# Patient Record
Sex: Female | Born: 1985
Health system: Southern US, Community
[De-identification: ages and names within clinical notes are randomized; demographics above are authoritative.]

## PROBLEM LIST (undated history)

## (undated) DIAGNOSIS — F419 Anxiety disorder, unspecified: Principal | ICD-10-CM

## (undated) HISTORY — DX: Anxiety disorder, unspecified: F41.9

---

## 2007-12-18 ENCOUNTER — Emergency Department (HOSPITAL_COMMUNITY): Admission: EM | Admit: 2007-12-18 | Discharge: 2007-12-18 | Payer: Self-pay | Admitting: Emergency Medicine

## 2010-09-18 LAB — TSH: TSH: 1.2 u[IU]/mL (ref 0.41–5.90)

## 2010-09-18 LAB — HEPATIC FUNCTION PANEL: Bilirubin, Total: 0.4 mg/dL

## 2012-04-13 ENCOUNTER — Ambulatory Visit (INDEPENDENT_AMBULATORY_CARE_PROVIDER_SITE_OTHER): Payer: 59 | Admitting: Family Medicine

## 2012-04-13 ENCOUNTER — Encounter: Payer: Self-pay | Admitting: Family Medicine

## 2012-04-13 VITALS — BP 134/89 | HR 94 | Ht 62.0 in | Wt 141.0 lb

## 2012-04-13 DIAGNOSIS — F411 Generalized anxiety disorder: Secondary | ICD-10-CM

## 2012-04-13 DIAGNOSIS — F329 Major depressive disorder, single episode, unspecified: Secondary | ICD-10-CM

## 2012-04-13 DIAGNOSIS — F419 Anxiety disorder, unspecified: Secondary | ICD-10-CM | POA: Insufficient documentation

## 2012-04-13 DIAGNOSIS — F32A Depression, unspecified: Secondary | ICD-10-CM

## 2012-04-13 HISTORY — DX: Anxiety disorder, unspecified: F41.9

## 2012-04-13 MED ORDER — CLONAZEPAM 0.5 MG PO TABS: 0.2500 mg | ORAL_TABLET | Freq: Two times a day (BID) | ORAL | Status: DC | PRN

## 2012-04-13 MED ORDER — CITALOPRAM HYDROBROMIDE 10 MG PO TABS: 10.0000 mg | ORAL_TABLET | Freq: Every day | ORAL | Status: DC

## 2012-04-13 NOTE — Progress Notes (Signed)
CC: Melanie Fritz is a 26 y.o. female is here for Establish Care and Medication Refill   Subjective: HPI:  Last papsmear 2 year ago  Pleasant 26 year old here to establish care, former provider at [facility] no longer there.   carries a diagnosis of anxiety for the past 2 years. Was originally placed on Klonopin has not tried any other agents. She's pleased with the way this controls her anxiety. Anxiety is mild in severity at this time. Seems to be worse more time she spends with her mother, seems to be  exacerbated when arguing with her significant other.  Improves more time she spends on her own interests and hobbies, improves with Klonopin. Anxiety can be present anytime of the day. Nothing else makes better or worse.  Over the past 2 weeks have you been bothered by: - Little interest or pleasure in doing things: yes - Feeling down depressed or hopeless:yes  She reports crying spells 3 weeks ago that involved crying episodes occurring multiple times daily for no particular reason. This is improved somewhat but she still feels feelings of sadness for reason she's not sure of. She's had some decreased appetite, decreased interest in spending time with her significant other, continues to want to stay social and denies sleep disturbances. She's worried that she may be on the verge of a breakup with her significant other. Feelings have been present for 3 weeks, mild to moderate in severity, nothing seems to make better or worse, feelings can be present all hours of the day. No interventions as of yet. Denies tobacco use, recreational drug use, and only drinks sparingly  Review of Systems - General ROS: negative for - chills, fever, night sweats, weight gain or weight loss Ophthalmic ROS: negative for - decreased vision Psychological ROS: negative for - paranoia, mental disturbance, mania ENT ROS: negative for - hearing change, nasal congestion, tinnitus or allergies Hematological and Lymphatic  ROS: negative for - bleeding problems, bruising or swollen lymph nodes Breast ROS: negative Respiratory ROS: no cough, shortness of breath, or wheezing Cardiovascular ROS: no chest pain or dyspnea on exertion Gastrointestinal ROS: no abdominal pain, change in bowel habits, or black or bloody stools Genito-Urinary ROS: negative for - genital discharge, genital ulcers, incontinence or abnormal bleeding from genitals Musculoskeletal ROS: negative for - joint pain or muscle pain Neurological ROS: negative for - headaches or memory loss Dermatological ROS: negative for lumps, mole changes, rash and skin lesion changes  Past Medical History  Diagnosis Date  . Anxiety 04/13/2012     Family History  Problem Relation Age of Onset  . Diabetes Father   . Leukemia    . Heart attack      grandfather  . Hypertension Mother   . Stroke      grandfather     History  Substance Use Topics  . Smoking status: Former Smoker    Quit date: 10/13/2010  . Smokeless tobacco: Not on file  . Alcohol Use: Yes     Objective: Filed Vitals:   04/13/12 0905  BP: 134/89  Pulse: 94    General: Alert and Oriented, No Acute Distress HEENT: Pupils equal, round, reactive to light. Conjunctivae clear.  External ears unremarkable, canals clear with intact TMs with appropriate landmarks.  Middle ear appears open without effusion. Pink inferior turbinates.  Moist mucous membranes, pharynx without inflammation nor lesions.  Neck supple without palpable lymphadenopathy nor abnormal masses. Lungs: Clear to auscultation bilaterally, no wheezing/ronchi/rales.  Comfortable work of breathing. Good  air movement. Cardiac: Regular rate and rhythm. Normal S1/S2.  No murmurs, rubs, nor gallops.   Extremities: No peripheral edema.  Strong peripheral pulses.  Mental Status: No depression, anxiety, nor agitation. Well-dressed, good eye contact, normal and logical thought process Skin: Warm and dry.  Assessment & Plan: Melanie Fritz  was seen today for establish care and medication refill.  Diagnoses and associated orders for this visit:  Anxiety - clonazePAM (KLONOPIN) 0.5 MG tablet; Take 0.5 tablets (0.25 mg total) by mouth 2 (two) times daily as needed for anxiety. 1/2 tablet twice daily  Depression - citalopram (CELEXA) 10 MG tablet; Take 1 tablet (10 mg total) by mouth daily.  Other Orders - Discontinue: clonazePAM (KLONOPIN) 0.5 MG tablet; Take 0.5 mg by mouth. 1/2 tablet twice daily    Anxiety: Controlled, continue Klonopin, provided 1 month prescription with the understanding that she signs a records release for progress notes and labs from her former provider confirming her report and Klonopin regimen. Unable to access [facility] controlled substance database at the time of her visit. Depression: Clinically it sounds like she has an element of depression, mild. She is open to the idea of starting SSRI, discussed that this may even help limit her need for Klonopin. Return in 4 weeks to assess response. Return in about 4 weeks (around 05/11/2012).

## 2012-05-03 ENCOUNTER — Encounter: Payer: Self-pay | Admitting: Family Medicine

## 2012-05-03 DIAGNOSIS — N760 Acute vaginitis: Secondary | ICD-10-CM

## 2012-05-03 DIAGNOSIS — B9689 Other specified bacterial agents as the cause of diseases classified elsewhere: Secondary | ICD-10-CM | POA: Insufficient documentation

## 2012-05-03 DIAGNOSIS — F419 Anxiety disorder, unspecified: Secondary | ICD-10-CM

## 2012-05-05 ENCOUNTER — Encounter: Payer: Self-pay | Admitting: *Deleted

## 2012-05-11 ENCOUNTER — Ambulatory Visit (INDEPENDENT_AMBULATORY_CARE_PROVIDER_SITE_OTHER): Payer: 59 | Admitting: Family Medicine

## 2012-05-11 ENCOUNTER — Encounter: Payer: Self-pay | Admitting: Family Medicine

## 2012-05-11 ENCOUNTER — Other Ambulatory Visit: Payer: Self-pay | Admitting: Family Medicine

## 2012-05-11 VITALS — BP 114/79 | HR 86 | Wt 142.0 lb

## 2012-05-11 DIAGNOSIS — N898 Other specified noninflammatory disorders of vagina: Secondary | ICD-10-CM

## 2012-05-11 DIAGNOSIS — F411 Generalized anxiety disorder: Secondary | ICD-10-CM

## 2012-05-11 DIAGNOSIS — F419 Anxiety disorder, unspecified: Secondary | ICD-10-CM

## 2012-05-11 MED ORDER — METRONIDAZOLE 0.75 % VA GEL: 1.0000 | Freq: Two times a day (BID) | VAGINAL | Status: DC

## 2012-05-11 MED ORDER — CLONAZEPAM 0.5 MG PO TABS: 0.2500 mg | ORAL_TABLET | Freq: Two times a day (BID) | ORAL | Status: DC | PRN

## 2012-05-11 NOTE — Progress Notes (Signed)
CC: Melanie Fritz is a 27 y.o. female is here for Anxiety and Depression   Subjective: HPI:  Followup  Anxiety: Was started on citalopram at her last visit, made anxiety worse so she discontinued it herself. Has been taking a dose of Klonopin twice a day which has"drastically"improve her quality of life and has significantly decreased her anxiety. Is described as mild to absent on a daily basis. Worsened with the holiday season at work, improved now that scheduled more predictable. Relationship with mother and patient's significant other is going great. She denies depression, anxiety, paranoia, hallucinations, nor mental disturbance. Records reviewed from her former provider  Complains of recurrent vaginal itching and thick discharge at the end of her menstrual cycle. This is been going on for years and is mentioned in outside records. Response to vaginal metronidazole. Nothing else makes better or worse. Happens monthly. Denies pelvic pain during prior or after menstruation. Denies fevers, chills, nor back pain nor dysuria. Requests suppressive therapy if possible and confirmation of current vaginal itching possibly do to Bactrim diagnosis. Unscented tampons, yogurt daily, no douching  Review Of Systems Outlined In HPI  Past Medical History  Diagnosis Date  . Anxiety 04/13/2012     Family History  Problem Relation Age of Onset  . Diabetes Father   . Leukemia    . Heart attack      grandfather  . Hypertension Mother   . Stroke      grandfather     History  Substance Use Topics  . Smoking status: Former Smoker    Quit date: 10/13/2010  . Smokeless tobacco: Not on file  . Alcohol Use: Yes     Objective: Filed Vitals:   05/11/12 0925  BP: 114/79  Pulse: 86    General: Alert and Oriented, No Acute Distress HEENT: Pupils equal, round, reactive to light. Conjunctivae clear.   Moist mucous membranes,    Lungs: Clear to auscultation bilaterally, no wheezing/ronchi/rales.   Comfortable work of breathing. Good air movement. Cardiac: Regular rate and rhythm. Normal S1/S2.  No murmurs, rubs, nor gallops.   Abdomen: Nontender Extremities: No peripheral edema.  Strong peripheral pulses.  Mental Status: No depression, anxiety, nor agitation. Good eye contact, pleasant, well-dressed, logical thought process Skin: Warm and dry.  Assessment & Plan: Melanie Fritz was seen today for anxiety and depression.  Diagnoses and associated orders for this visit:  Anxiety - clonazePAM (KLONOPIN) 0.5 MG tablet; Take 0.5 tablets (0.25 mg total) by mouth 2 (two) times daily as needed for anxiety.  Vaginal discharge - metroNIDAZOLE (METROGEL) 0.75 % vaginal gel; Place 1 Applicatorful vaginally 2 (two) times daily. - Wet prep, genital    Vaginal discharge: She would prefer to self swabbed when her period is over, provided with sample collection kit. We'll treat empirically with metronidazole once her period stops. If Bv is confirmed we'll consider suppression therapv / refills Anxiety: Stable and improved, discontinue citalopram continue Klonopin  Return in about 3 months (around 08/09/2012).

## 2012-05-17 ENCOUNTER — Telehealth: Payer: Self-pay | Admitting: Family Medicine

## 2012-05-17 LAB — WET PREP BY MOLECULAR PROBE: Candida species: NEGATIVE

## 2012-05-17 NOTE — Telephone Encounter (Signed)
Melanie Fritz, Will you please let Melanie Fritz know that I haven't forgotten about her, for some reason the lab is taking forever with her sample from last week.  I'll keep an eye out for it.

## 2012-05-18 ENCOUNTER — Telehealth: Payer: Self-pay | Admitting: Family Medicine

## 2012-05-18 DIAGNOSIS — N898 Other specified noninflammatory disorders of vagina: Secondary | ICD-10-CM

## 2012-05-18 DIAGNOSIS — N760 Acute vaginitis: Secondary | ICD-10-CM | POA: Insufficient documentation

## 2012-05-18 MED ORDER — METRONIDAZOLE 0.75 % VA GEL: VAGINAL | Status: DC

## 2012-05-18 NOTE — Telephone Encounter (Signed)
Left messsage to call back

## 2012-05-18 NOTE — Telephone Encounter (Signed)
Sue Lush, Will you please let Melanie Fritz know that her swab results came back confirming the bacterial infection she suspected.  If she already completed the gel treatment we discussed she can use a suppression regimen with the same medication applied twice a week.  Literature suggests doing this for at least four months so I've sent in a new Rx with plenty [facility] [place]. Thank you

## 2012-05-19 NOTE — Telephone Encounter (Signed)
Pt.notified

## 2012-06-06 ENCOUNTER — Ambulatory Visit (INDEPENDENT_AMBULATORY_CARE_PROVIDER_SITE_OTHER): Payer: 59 | Admitting: Family Medicine

## 2012-06-06 VITALS — BP 125/85 | HR 96 | Wt 146.0 lb

## 2012-06-06 DIAGNOSIS — F329 Major depressive disorder, single episode, unspecified: Secondary | ICD-10-CM

## 2012-06-06 DIAGNOSIS — F32A Depression, unspecified: Secondary | ICD-10-CM

## 2012-06-06 MED ORDER — BUPROPION HCL ER (XL) 150 MG PO TB24: 150.0000 mg | ORAL_TABLET | ORAL | Status: DC

## 2012-06-06 NOTE — Progress Notes (Signed)
CC: Melanie Fritz is a 27 y.o. female is here for Anxiety and Depression   Subjective: HPI:   patient presents with concerns for a remote history of depression may be returning. For the past 4 weeks she is noticed increased irritability, snapping at loved ones and coworkers. Associated with a daily sense of sadness and gloom.  Above symptoms are described as moderate in severity. Nothing particularly makes them better or worse, she cannot think of any life changing events that precipitated these symptoms.  She also complains of unintentional weight gain, and early-morning awakening on most nights of the week. No interventions as of yet. Symptoms are present on a daily basis. She denies thoughts of wanting to harm herself or others. She feels her anxiety is relatively well controlled right now. She denies manic symptoms, hallucinations, paranoia. She denies rapid heartbeat, tremor, unintentional weight loss, shortness of breath, chest pain, GI disturbance.    Review Of Systems Outlined In HPI  Past Medical History  Diagnosis Date  . Anxiety 04/13/2012     Family History  Problem Relation Age of Onset  . Diabetes Father   . Leukemia    . Heart attack      grandfather  . Hypertension Mother   . Stroke      grandfather     History  Substance Use Topics  . Smoking status: Former Smoker    Quit date: 10/13/2010  . Smokeless tobacco: Not on file  . Alcohol Use: Yes     Objective: Filed Vitals:   06/06/12 0901  BP: 125/85  Pulse: 96    General: Alert and Oriented, No Acute Distress HEENT: Pupils equal, round, reactive to light. Conjunctivae clear. Moist mucous membranes Lungs: Clear and comfortable work of breathing Cardiac: Regular rate and rhythm.  Extremities: No peripheral edema.  Strong peripheral pulses.  Mental Status: No anxiety, nor agitation. Well-dressed, logical thought process, good eye contact. Skin: Warm and dry.  Assessment & Plan: Melanie Fritz was seen today for  anxiety and depression.  Diagnoses and associated orders for this visit:  Depression - buPROPion (WELLBUTRIN XL) 150 MG 24 hr tablet; Take 1 tablet (150 mg total) by mouth every morning.    Depression: Uncontrolled, Patient did not respond much to Zoloft therefore will try Wellbutrin and consider Effexor if not improved in 4 weeks. Discussed self titrating to 300 mg every morning if no improvement after 2 weeks.  Return in about 4 weeks (around 07/04/2012).

## 2012-06-23 ENCOUNTER — Telehealth: Payer: Self-pay | Admitting: *Deleted

## 2012-06-23 DIAGNOSIS — F329 Major depressive disorder, single episode, unspecified: Secondary | ICD-10-CM

## 2012-06-23 DIAGNOSIS — F32A Depression, unspecified: Secondary | ICD-10-CM

## 2012-06-23 NOTE — Telephone Encounter (Signed)
Pt calls & states that the wellbutrin you started her on has been making her very sick on her stomach & is giving her back headaches.

## 2012-06-24 MED ORDER — VENLAFAXINE HCL ER 75 MG PO CP24: ORAL_CAPSULE | ORAL | Status: DC

## 2012-06-24 NOTE — Telephone Encounter (Signed)
Left detailed message on vm and to call back if any questions

## 2012-06-24 NOTE — Telephone Encounter (Signed)
Melanie Fritz, Will you please ask Melanie Fritz to switch from wellbutrin to effexor using the following plan: If she has been taking wellbutrin 300mg  daily then cut to 150mg  daily for three days then start new regimen of effexor.  If she's only been taking 150mg  daily then she can switch immediately to the new effexor regimen.  [facility]

## 2012-07-04 ENCOUNTER — Ambulatory Visit (INDEPENDENT_AMBULATORY_CARE_PROVIDER_SITE_OTHER): Payer: 59 | Admitting: Family Medicine

## 2012-07-04 ENCOUNTER — Encounter: Payer: Self-pay | Admitting: Family Medicine

## 2012-07-04 VITALS — BP 114/73 | HR 104 | Wt 149.0 lb

## 2012-07-04 DIAGNOSIS — B9689 Other specified bacterial agents as the cause of diseases classified elsewhere: Secondary | ICD-10-CM

## 2012-07-04 DIAGNOSIS — N760 Acute vaginitis: Secondary | ICD-10-CM

## 2012-07-04 DIAGNOSIS — A499 Bacterial infection, unspecified: Secondary | ICD-10-CM

## 2012-07-04 DIAGNOSIS — F32A Depression, unspecified: Secondary | ICD-10-CM

## 2012-07-04 DIAGNOSIS — F329 Major depressive disorder, single episode, unspecified: Secondary | ICD-10-CM

## 2012-07-04 MED ORDER — METRONIDAZOLE 0.75 % VA GEL: VAGINAL | Status: DC

## 2012-07-04 MED ORDER — VENLAFAXINE HCL ER 75 MG PO CP24: ORAL_CAPSULE | ORAL | Status: DC

## 2012-07-04 NOTE — Progress Notes (Signed)
CC: Melanie Fritz is a 27 y.o. female is here for Depression   Subjective: HPI:  Followup depression: Patient tried Wellbutrin on a daily basis  For about 2 weeks however developed almost daily single episode of vomiting and migraines most days of the week. Interestingly her depression and short temper were completely resolved while on the medication. She did not want to tolerate the side effects understandably so stopped 2 weeks ago. She continues to have daily snapping episodes in a short temper with her colleagues and significant other. She denies worsening severity or frequency of symptoms since last being seen.  Unfortunately she did not get the message about switching to Effexor.  She denies mental disturbance, paranoia, hallucinations, sleep disturbance, nor thoughts of it on herself or others  She has a history of bacterial vaginosis on a monthly basis following her menses. She was never notified by her pharmacy that a suppression regimen of MetroGel was sent there a few months ago. She denies any genitourinary complaints today   Review Of Systems Outlined In HPI  Past Medical History  Diagnosis Date  . Anxiety 04/13/2012     Family History  Problem Relation Age of Onset  . Diabetes Father   . Leukemia    . Heart attack      grandfather  . Hypertension Mother   . Stroke      grandfather     History  Substance Use Topics  . Smoking status: Former Smoker    Quit date: 10/13/2010  . Smokeless tobacco: Not on file  . Alcohol Use: Yes     Objective: Filed Vitals:   07/04/12 0900  BP: 114/73  Pulse: 104    Vital signs reviewed. General: Alert and Oriented, No Acute Distress HEENT: Pupils equal, round, reactive to light. Conjunctivae clear.  External ears unremarkable.  Moist mucous membranes. Lungs: Clear and comfortable work of breathing, speaking in full sentences without accessory muscle use. Cardiac: Regular rate and rhythm.  Neuro: CN II-XII grossly intact, gait  normal. Extremities: No peripheral edema.  Strong peripheral pulses.  Mental Status: No depression, anxiety, nor agitation. Logical though process. Skin: Warm and dry.  Assessment & Plan: Malarie was seen today for depression.  Diagnoses and associated orders for this visit:  Bacterial vaginitis - metroNIDAZOLE (METROGEL VAGINAL) 0.75 % vaginal gel; Place one applicator vaginally QHS twice a week for four months.  Depression - venlafaxine XR (EFFEXOR XR) 75 MG 24 hr capsule; Start with one capsule daily for five days then two capsules daily from then on.    Depression: Uncontrolled, switching to Effexor and discussed titrating regimen. Followup in 2 weeks to address effectiveness. Recurrent bacterial vaginitis: Suppression regimen sent to a new pharmacy for her.  Return in about 2 weeks (around 07/18/2012).

## 2012-07-30 ENCOUNTER — Other Ambulatory Visit: Payer: Self-pay | Admitting: Family Medicine

## 2012-08-01 ENCOUNTER — Ambulatory Visit: Payer: 59 | Admitting: Family Medicine

## 2012-08-10 ENCOUNTER — Telehealth: Payer: Self-pay | Admitting: Family Medicine

## 2012-08-10 NOTE — Telephone Encounter (Signed)
[facility] as she's no longer taking this.

## 2012-08-22 ENCOUNTER — Ambulatory Visit (HOSPITAL_BASED_OUTPATIENT_CLINIC_OR_DEPARTMENT_OTHER)
Admission: RE | Admit: 2012-08-22 | Discharge: 2012-08-22 | Disposition: A | Discharge status: Home / Self Care | Payer: 59 | Source: Ambulatory Visit | Attending: Family Medicine | Admitting: Family Medicine

## 2012-08-22 ENCOUNTER — Ambulatory Visit (INDEPENDENT_AMBULATORY_CARE_PROVIDER_SITE_OTHER): Payer: 59 | Admitting: Family Medicine

## 2012-08-22 ENCOUNTER — Encounter: Payer: Self-pay | Admitting: Family Medicine

## 2012-08-22 VITALS — BP 119/86 | HR 86 | Wt 151.0 lb

## 2012-08-22 DIAGNOSIS — R5383 Other fatigue: Secondary | ICD-10-CM

## 2012-08-22 DIAGNOSIS — Q742 Other congenital malformations of lower limb(s), including pelvic girdle: Secondary | ICD-10-CM | POA: Insufficient documentation

## 2012-08-22 DIAGNOSIS — M25579 Pain in unspecified ankle and joints of unspecified foot: Secondary | ICD-10-CM

## 2012-08-22 DIAGNOSIS — M25476 Effusion, unspecified foot: Secondary | ICD-10-CM | POA: Insufficient documentation

## 2012-08-22 DIAGNOSIS — S93401A Sprain of unspecified ligament of right ankle, initial encounter: Secondary | ICD-10-CM

## 2012-08-22 DIAGNOSIS — M25571 Pain in right ankle and joints of right foot: Secondary | ICD-10-CM

## 2012-08-22 DIAGNOSIS — R5381 Other malaise: Secondary | ICD-10-CM

## 2012-08-22 DIAGNOSIS — M25473 Effusion, unspecified ankle: Secondary | ICD-10-CM | POA: Insufficient documentation

## 2012-08-22 DIAGNOSIS — S93409A Sprain of unspecified ligament of unspecified ankle, initial encounter: Secondary | ICD-10-CM

## 2012-08-22 LAB — BASIC METABOLIC PANEL WITH GFR
BUN: 8 mg/dL (ref 6–23)
Chloride: 102 mEq/L (ref 96–112)
Creat: 0.62 mg/dL (ref 0.50–1.10)
GFR, Est Non African American: >89 mL/min

## 2012-08-22 LAB — CBC
HCT: 38.2 % (ref 36.0–46.0)
MCH: 31.2 pg (ref 26.0–34.0)
MCV: 89 fL (ref 78.0–100.0)
RDW: 13.4 % (ref 11.5–15.5)
WBC: 5.4 10*3/uL (ref 4.0–10.5)

## 2012-08-22 MED ORDER — TRAMADOL HCL 50 MG PO TABS: 50.0000 mg | ORAL_TABLET | Freq: Four times a day (QID) | ORAL | Status: DC | PRN

## 2012-08-22 MED ORDER — MELOXICAM 15 MG PO TABS: 15.0000 mg | ORAL_TABLET | Freq: Every day | ORAL | Status: DC

## 2012-08-22 NOTE — Progress Notes (Signed)
CC: Melanie Fritz is a 27 y.o. female is here for right ankle pain   Subjective: HPI:  Patient complains of right ankle pain. This has been present for the past 3 weeks. It came on gradually and today that she sustained a inversion injury of the right ankle while carrying large bags of money at work. She had immediate electric-like pain that radiated up her right leg but was able to walk at the time of injury without much discomfort. Pain reached maximum the next day, described only as pain, moderate to severe in severity. Slightly improved with ibuprofen. Worse with weightbearing or moving of the foot, slightly improved with rest. Associated with swelling that is absent in the morning and worsens as the day progresses. Has been wearing a ankle brace but she feels that it's too loose. She denies motor or sensory disturbances in the right leg or foot.  Localizes pain to the lateral aspect of the ankle and slightly radiates up her right leg.  Denies any prior ankle injury.  Patient complains of fatigue that has been present for one to 2 months. Is presently daily basis. Described as mild to moderate in severity. Nothing makes it better or worse. She denies sleep disturbance, anxiety, depression, nor mental disturbance. He has been accompanied by unintentional weight gain without change in diet or exercise.  Denies fevers, chills, cough, shortness of breath, chest pain, abdominal pain, GI disturbance.   Review Of Systems Outlined In HPI  Past Medical History  Diagnosis Date  . Anxiety 04/13/2012     Family History  Problem Relation Age of Onset  . Diabetes Father   . Leukemia    . Heart attack      grandfather  . Hypertension Mother   . Stroke      grandfather     History  Substance Use Topics  . Smoking status: Former Smoker    Quit date: 10/13/2010  . Smokeless tobacco: Not on file  . Alcohol Use: Yes     Objective: Filed Vitals:   08/22/12 1011  BP: 119/86  Pulse: 86     Vital signs reviewed. General: Alert and Oriented, No Acute Distress HEENT: Pupils equal, round, reactive to light. Conjunctivae clear.  External ears unremarkable.  Moist mucous membranes. Lungs: Clear and comfortable work of breathing, speaking in full sentences without accessory muscle use. Cardiac: Regular rate and rhythm.  Neuro: CN II-XII grossly intact, gait normal. Extremities: No peripheral edema.  Strong peripheral pulses.  Right ankle: Anterior for negative. no pain at the navicular bone, fifth metatarsal nor medial malleolus. Pain is reproduced with palpation of any aspect of distal lateral malleoli or resisted inversion. Mild swelling at the site of pain. Pain is reproduced with compression of the fibula and tibia without pain at head of fibula on direct palpation. Able to bear weight but in obvious discomfort. Mental Status: No depression, anxiety, nor agitation. Logical though process. Skin: Warm and dry.  Assessment & Plan: Melanie Fritz was seen today for right ankle pain.  Diagnoses and associated orders for this visit:  Fatigue - TSH - CBC - BASIC METABOLIC PANEL WITH GFR - B12  Right ankle pain - DG Ankle Complete Right; Future  Right ankle sprain, initial encounter - meloxicam (MOBIC) 15 MG tablet; Take 1 tablet (15 mg total) by mouth daily. - traMADol (ULTRAM) 50 MG tablet; Take 1 tablet (50 mg total) by mouth every 6 (six) hours as needed for pain.    Fatigue: We'll rule out  anemia, thyroid abnormality, B12 deficiency, metabolic issues with labs above. Right ankle pain: X-ray was obtained it did not show a bulge in nor fracture, images were reviewed with the patient and reassurance was given.  Right ankle sprain. She was fitted in a Aircast ankle brace and crutches and we discussed rehabilitation exercises to do at home with a handout demonstrating different modalities. I've asked her to return in 1-2 weeks if not improving at all and to see Dr. Karie Schwalbe. in sports  medicine for specialist evaluation.  25 minutes spent face-to-face during visit today of which at least 50% was counseling or coordinating care regarding right ankle sprain, fatigue.Marland Kitchen   Return in about 2 weeks (around 09/05/2012).

## 2012-09-05 ENCOUNTER — Ambulatory Visit (INDEPENDENT_AMBULATORY_CARE_PROVIDER_SITE_OTHER): Payer: 59 | Admitting: Sports Medicine

## 2012-09-05 ENCOUNTER — Encounter: Payer: Self-pay | Admitting: Sports Medicine

## 2012-09-05 VITALS — BP 132/89 | HR 89

## 2012-09-05 DIAGNOSIS — M25579 Pain in unspecified ankle and joints of unspecified foot: Secondary | ICD-10-CM

## 2012-09-05 DIAGNOSIS — M25571 Pain in right ankle and joints of right foot: Secondary | ICD-10-CM

## 2012-09-05 DIAGNOSIS — S93401A Sprain of unspecified ligament of right ankle, initial encounter: Secondary | ICD-10-CM | POA: Insufficient documentation

## 2012-09-05 MED ORDER — HYDROCODONE-ACETAMINOPHEN 5-325 MG PO TABS: 1.0000 | ORAL_TABLET | Freq: Three times a day (TID) | ORAL | Status: DC | PRN

## 2012-09-05 NOTE — Progress Notes (Signed)
   Subjective:    I'm seeing this patient as a consultation for:  Dr. 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 She's had significant pain since then but never sought medical care. Now she localizes her pain over the ATFL., radiating around the malleolus laterally and up the leg. It is very difficult for her to bear weight, she recalls when she had the injury, having bruising and significant swelling.   Pain is severe, stable. She borrowed a Designer, multimedia from her sister 2 days ago and notes that this helps her pain significantly.   Past medical history, Surgical history, Family history not pertinant except as noted below, Social history, Allergies, and medications have been entered into the medical record, reviewed, and no changes needed.   Review of Systems: No headache, visual changes, nausea, vomiting, diarrhea, constipation, dizziness, abdominal pain, skin rash, fevers, chills, night sweats, weight loss, swollen lymph nodes, body aches, joint swelling, muscle aches, chest pain, shortness of breath, mood changes, visual or auditory hallucinations.   Objective:   General: Well Developed, well nourished, and in no acute distress.  Neuro/Psych: Alert and oriented x3, extra-ocular muscles intact, able to move all 4 extremities, sensation grossly intact. Skin: Warm and dry, no rashes noted.  Respiratory: Not using accessory muscles, speaking in full sentences, trachea midline.  Cardiovascular: Pulses palpable, no extremity edema. Abdomen: Does not appear distended. Right Ankle: Phyllis over the sinus tarsi. Range of motion is full in all directions. Strength is 5/5 in all directions. Stable lateral and medial ligaments; squeeze test and kleiger test unremarkable; Tender to palpation over the lateral talar dome as well as the ATF  L. No pain at base of 5th MT; No tenderness over cuboid; No tenderness over N spot or navicular prominence No tenderness on posterior aspects of lateral and medial malleolus mild tenderness behind the lateral malleolus No sign of peroneal tendon subluxations or tenderness to palpation Negative tarsal tunnel tinel's Able to walk 4 steps, but limps.   I reviewed her x-rays, there is no sign of acute fracture or dislocation or degenerative change. Impression and Recommendations:   This case required medical decision making of moderate complexity.

## 2012-09-05 NOTE — Assessment & Plan Note (Addendum)
I think that Melanie Fritz has a fairly severe, grade 2 ATF L. sprain. I also think that she has bruised her lateral talar dome. Unfortunately this has gone for about 3 months. She's only been immobilization now for approximately 2 days. Continue Cam Boot, I strapped the ankle with compressive dressing. Upgrading to hydrocodone and continue meloxicam. Continue to come out of the boot and nightly to do rehabilitation exercises. Out of work, and nonweightbearing for 2 weeks. Crutches as long as it's painful to bear weight.

## 2012-09-20 ENCOUNTER — Encounter: Payer: Self-pay | Admitting: Sports Medicine

## 2012-09-20 ENCOUNTER — Ambulatory Visit (INDEPENDENT_AMBULATORY_CARE_PROVIDER_SITE_OTHER): Payer: 59 | Admitting: Sports Medicine

## 2012-09-20 ENCOUNTER — Other Ambulatory Visit: Payer: Self-pay | Admitting: *Deleted

## 2012-09-20 VITALS — BP 124/81 | HR 92

## 2012-09-20 DIAGNOSIS — F32A Depression, unspecified: Secondary | ICD-10-CM

## 2012-09-20 DIAGNOSIS — M25571 Pain in right ankle and joints of right foot: Secondary | ICD-10-CM

## 2012-09-20 DIAGNOSIS — F329 Major depressive disorder, single episode, unspecified: Secondary | ICD-10-CM

## 2012-09-20 DIAGNOSIS — M25579 Pain in unspecified ankle and joints of unspecified foot: Secondary | ICD-10-CM

## 2012-09-20 MED ORDER — VENLAFAXINE HCL ER 75 MG PO CP24: ORAL_CAPSULE | ORAL | Status: DC

## 2012-09-20 NOTE — Progress Notes (Signed)
Disability forms filled out and placed in my out box. Charge sheet filled out.

## 2012-09-20 NOTE — Progress Notes (Signed)
  Subjective:    CC: Followup  HPI: Right ankle sprain: Likely grade 2 anterior talofibular ligament as well as lateral talar dome. She returns today after 3 months of pain, I placed her in Cam Boot immobilization for 2 weeks and she is approximately 80% improved. She still has some pain with weightbearing, but has self discontinued. Pain is localized, doesn't radiate, moderate. Swelling is improved as well. She is not sure she is ready to go back to work yet on the [employer], and I tend to agree.  Past medical history, Surgical history, Family history not pertinant except as noted below, Social history, Allergies, and medications have been entered into the medical record, reviewed, and no changes needed.   Review of Systems: No fevers, chills, night sweats, weight loss, chest pain, or shortness of breath.   Objective:    General: Well Developed, well nourished, and in no acute distress.  Neuro: Alert and oriented x3, extra-ocular muscles intact, sensation grossly intact.  HEENT: Normocephalic, atraumatic, pupils equal round reactive to light, neck supple, no masses, no lymphadenopathy, thyroid nonpalpable.  Skin: Warm and dry, no rashes. Cardiac: Regular rate and rhythm, no murmurs rubs or gallops, no lower extremity edema.  Respiratory: Clear to auscultation bilaterally. Not using accessory muscles, speaking in full sentences. Right Ankle: No visible erythema or swelling. Range of motion is full in all directions. Strength is 5/5 in all directions. Stable lateral and medial ligaments; squeeze test and kleiger test unremarkable; Lateral talar dome was very tender to palpation. Extremely tender to palpation over the anterior talus at the site of the insertion of the anterior talofibular ligament. No pain at base of 5th MT; No tenderness over cuboid; No tenderness over N spot or navicular prominence No tenderness on posterior aspects of lateral and medial malleolus No sign of peroneal  tendon subluxations or tenderness to palpation Negative tarsal tunnel tinel's  Impression and Recommendations:

## 2012-09-20 NOTE — Assessment & Plan Note (Signed)
Over 80% improved with 2 weeks of cam boot immobilization. Unfortunately she still has pain over the lateral talar dome as well as the ATFL. Ankle is stable. I'm going to recommend that she go back into the boot for additional 2 weeks, I will fill out her FMLA paperwork She should come back to see me in 2 weeks, and I can likely clear her.

## 2012-09-29 ENCOUNTER — Encounter: Payer: Self-pay | Admitting: Sports Medicine

## 2012-09-29 ENCOUNTER — Ambulatory Visit (INDEPENDENT_AMBULATORY_CARE_PROVIDER_SITE_OTHER): Payer: 59 | Admitting: Sports Medicine

## 2012-09-29 VITALS — BP 122/83 | HR 103

## 2012-09-29 DIAGNOSIS — M25571 Pain in right ankle and joints of right foot: Secondary | ICD-10-CM

## 2012-09-29 DIAGNOSIS — M25579 Pain in unspecified ankle and joints of unspecified foot: Secondary | ICD-10-CM

## 2012-09-29 NOTE — Assessment & Plan Note (Addendum)
This did likely represent a simple lateral ankle sprain and talar dome contusion. She is out of the boot and pain-free, without limitations. She is fully cleared for work and I can see her back on an as-needed basis.

## 2012-09-29 NOTE — Progress Notes (Signed)
  Subjective:    CC:  followup  HPI: This very pleasant 27 year old female returns for followup of lateral ankle sprain and talar dome bruise. She is out of her boot, and completely pain-free. She is eager to get back into work without restrictions.  Past medical history, Surgical history, Family history not pertinant except as noted below, Social history, Allergies, and medications have been entered into the medical record, reviewed, and no changes needed.   Review of Systems: No fevers, chills, night sweats, weight loss, chest pain, or shortness of breath.   Objective:    General: Well Developed, well nourished, and in no acute distress.  Neuro: Alert and oriented x3, extra-ocular muscles intact, sensation grossly intact.  HEENT: Normocephalic, atraumatic, pupils equal round reactive to light, neck supple, no masses, no lymphadenopathy, thyroid nonpalpable.  Skin: Warm and dry, no rashes. Cardiac: Regular rate and rhythm, no murmurs rubs or gallops, no lower extremity edema.  Respiratory: Clear to auscultation bilaterally. Not using accessory muscles, speaking in full sentences. Right Ankle: No visible erythema or swelling. Range of motion is full in all directions. Strength is 5/5 in all directions. Stable lateral and medial ligaments; squeeze test and kleiger test unremarkable; Talar dome nontender; No pain at base of 5th MT; No tenderness over cuboid; No tenderness over N spot or navicular prominence No tenderness on posterior aspects of lateral and medial malleolus No sign of peroneal tendon subluxations or tenderness to palpation Negative tarsal tunnel tinel's Able to walk 4 steps. Impression and Recommendations:

## 2012-10-07 ENCOUNTER — Ambulatory Visit: Payer: 59 | Admitting: Sports Medicine

## 2012-11-28 ENCOUNTER — Encounter: Payer: Self-pay | Admitting: Family Medicine

## 2012-11-28 ENCOUNTER — Ambulatory Visit (INDEPENDENT_AMBULATORY_CARE_PROVIDER_SITE_OTHER): Payer: 59 | Admitting: Family Medicine

## 2012-11-28 VITALS — BP 110/79 | HR 71 | Wt 140.0 lb

## 2012-11-28 DIAGNOSIS — F411 Generalized anxiety disorder: Secondary | ICD-10-CM

## 2012-11-28 DIAGNOSIS — F419 Anxiety disorder, unspecified: Secondary | ICD-10-CM

## 2012-11-28 MED ORDER — CLONAZEPAM 0.5 MG PO TABS: 0.2500 mg | ORAL_TABLET | Freq: Two times a day (BID) | ORAL | Status: DC | PRN

## 2012-11-28 NOTE — Progress Notes (Signed)
CC: Melanie Fritz is a 27 y.o. female is here for Anxiety   Subjective: HPI:  Followup anxiety: Patient tells me that prior to 3 weeks ago she was not requiring any Effexor or any other psych medication for her chronic anxiety. Unfortunately 3 weeks ago she was promoted to a job it's causing more stress in her significant other left her, she is now living with her mother which is also causing stress. She describes moderate subjective anxiety on a daily basis that is worsened by her mom been a nuisance to her but for reasons that Shelton admits are ridiculous such as tapping to loud or just been annoying otherwise they have a great relationship.  Symptoms seem to improve to a mild degree when she is by herself. Nothing else makes better or worse. They've not changed since onset 3 weeks ago. She denies subjective depression, paranoia, manic symptoms, thoughts wanting to harm herself or others. She denies fevers, chills, substance use, alcohol use, unintentional weight loss or gain. She does seem to have some diarrhea on the days that she is most stressed out.   Review Of Systems Outlined In HPI  Past Medical History  Diagnosis Date  . Anxiety 04/13/2012     Family History  Problem Relation Age of Onset  . Diabetes Father   . Leukemia    . Heart attack      grandfather  . Hypertension Mother   . Stroke      grandfather     History  Substance Use Topics  . Smoking status: Former Smoker    Quit date: 10/13/2010  . Smokeless tobacco: Not on file  . Alcohol Use: Yes     Objective: Filed Vitals:   11/28/12 1311  BP: 110/79  Pulse: 71    Vital signs reviewed. General: Alert and Oriented, No Acute Distress HEENT: Pupils equal, round, reactive to light. Conjunctivae clear.  External ears unremarkable.  Moist mucous membranes. Lungs: Clear and comfortable work of breathing, speaking in full sentences without accessory muscle use. Cardiac: Regular rate and rhythm.  Neuro: CN II-XII  grossly intact, gait normal. Extremities: No peripheral edema.  Strong peripheral pulses.  Mental Status: No depression,  nor agitation. Appears mildly anxious Logical though process. Skin: Warm and dry.  Assessment & Plan: Chelli was seen today for anxiety.  Diagnoses and associated orders for this visit:  Anxiety - clonazePAM (KLONOPIN) 0.5 MG tablet; Take 0.5 tablets (0.25 mg total) by mouth 2 (two) times daily as needed for anxiety.    Anxiety: Chronic uncontrolled condition exacerbated due to job change in relationship change, discussed restarting Effexor but she dislikes some of the side effects. Discussed restarting former Klonopin regimen which she would rather do on a as-needed basis  Return in about 4 weeks (around 12/26/2012).

## 2013-02-10 ENCOUNTER — Ambulatory Visit (INDEPENDENT_AMBULATORY_CARE_PROVIDER_SITE_OTHER): Payer: 59 | Admitting: Family Medicine

## 2013-02-10 ENCOUNTER — Encounter: Payer: Self-pay | Admitting: Family Medicine

## 2013-02-10 VITALS — BP 119/83 | HR 82 | Wt 138.0 lb

## 2013-02-10 DIAGNOSIS — F411 Generalized anxiety disorder: Secondary | ICD-10-CM

## 2013-02-10 DIAGNOSIS — F419 Anxiety disorder, unspecified: Secondary | ICD-10-CM

## 2013-02-10 MED ORDER — CLONAZEPAM 0.5 MG PO TABS: 0.2500 mg | ORAL_TABLET | Freq: Three times a day (TID) | ORAL | Status: DC | PRN

## 2013-02-10 NOTE — Progress Notes (Signed)
CC: Melanie Fritz is a 27 y.o. female is here for Anxiety   Subjective: HPI:  Complains of worsening panic attacks described as rapid heartbeat, shortness of breath, extreme sense of fear of nothing in particular that has been worsening ever since she found her father dead to days ago. Symptoms are worsened by going through funeral and estate closure proceedings. Symptoms are improved when spending time with her brother and other family members. Other than above she denies any other anxiety or depression nor mental disturbance. She's having trouble sleeping because of the panic attacks they are occurring 2-3 times a day they are eradicate it by taking Klonopin she has run out of her prescription from 2 months ago. Denies cough, wheezing, chest pain, nausea nor fevers.   Review Of Systems Outlined In HPI  Past Medical History  Diagnosis Date  . Anxiety 04/13/2012     Family History  Problem Relation Age of Onset  . Diabetes Father   . Leukemia    . Heart attack      grandfather  . Hypertension Mother   . Stroke      grandfather     History  Substance Use Topics  . Smoking status: Former Smoker    Quit date: 10/13/2010  . Smokeless tobacco: Not on file  . Alcohol Use: Yes     Objective: Filed Vitals:   02/10/13 0905  BP: 119/83  Pulse: 82    Vital signs reviewed. General: Alert and Oriented, No Acute Distress HEENT: Pupils equal, round, reactive to light. Conjunctivae clear.  External ears unremarkable.  Moist mucous membranes. Lungs: Clear and comfortable work of breathing, speaking in full sentences without accessory muscle use. Cardiac: Regular rate and rhythm.  Neuro: CN II-XII grossly intact, gait normal. Extremities: No peripheral edema.  Strong peripheral pulses.  Mental Status: Mild tearfulness, no agitation agitation. Logical though process. Skin: Warm and dry.  Assessment & Plan: Symphany was seen today for anxiety.  Diagnoses and associated orders for this  visit:  Anxiety - clonazePAM (KLONOPIN) 0.5 MG tablet; Take 0.5 tablets (0.25 mg total) by mouth 3 (three) times daily as needed for anxiety.    Anxiety uncontrolled Patient declines counseling but we'll consider if greving continues into November, additionally we'll consider restarting Effexor if symptoms continue into November. Refill as needed Klonopin she understands to take sparingly and only as needed  Return if symptoms worsen or fail to improve.

## 2013-03-20 ENCOUNTER — Encounter: Payer: Self-pay | Admitting: Family Medicine

## 2013-03-20 ENCOUNTER — Ambulatory Visit (INDEPENDENT_AMBULATORY_CARE_PROVIDER_SITE_OTHER): Payer: 59 | Admitting: Family Medicine

## 2013-03-20 VITALS — BP 115/80 | HR 98 | Wt 132.0 lb

## 2013-03-20 DIAGNOSIS — F411 Generalized anxiety disorder: Secondary | ICD-10-CM

## 2013-03-20 DIAGNOSIS — F3289 Other specified depressive episodes: Secondary | ICD-10-CM

## 2013-03-20 DIAGNOSIS — F32A Depression, unspecified: Secondary | ICD-10-CM

## 2013-03-20 DIAGNOSIS — F329 Major depressive disorder, single episode, unspecified: Secondary | ICD-10-CM

## 2013-03-20 DIAGNOSIS — F419 Anxiety disorder, unspecified: Secondary | ICD-10-CM

## 2013-03-20 MED ORDER — VENLAFAXINE HCL ER 75 MG PO CP24: ORAL_CAPSULE | ORAL | Status: DC

## 2013-03-20 NOTE — Progress Notes (Signed)
CC: Melanie Fritz is a 27 y.o. female is here for f/u anxiety   Subjective: HPI:  Followup anxiety colon subjective a she reports she is dealing well with her father's death. The more she talks about the circumstances with her brother the better she feels. She reports there is moderate anxiety every evening when trying to bend this is improved with Klonopin. She reports nonrestorative sleep in the morning, she wants to stay in bed all day long despite weekends or work days. She feels she has low motivation to engage in hobbies or socialize, this is all been present since the time of her father's death, approximately one month ago. She is reluctant to use Klonopin understandably in the daytime due to the sedative effect.  She reports mild subjective depression.  Denies mental disturbance other than above, hallucinations, thoughts of wanting to harm herself or others, paranoia, nor unintentional weight loss or gain. Symptoms are present in a daily basis   Review Of Systems Outlined In HPI  Past Medical History  Diagnosis Date  . Anxiety 04/13/2012     Family History  Problem Relation Age of Onset  . Diabetes Father   . Leukemia    . Heart attack      grandfather  . Hypertension Mother   . Stroke      grandfather     History  Substance Use Topics  . Smoking status: Former Smoker    Quit date: 10/13/2010  . Smokeless tobacco: Not on file  . Alcohol Use: Yes     Objective: Filed Vitals:   03/20/13 0956  BP: 115/80  Pulse: 98    Vital signs reviewed. General: Alert and Oriented, No Acute Distress HEENT: Pupils equal, round, reactive to light. Conjunctivae clear.  External ears unremarkable.  Moist mucous membranes. Lungs: Clear and comfortable work of breathing, speaking in full sentences without accessory muscle use. Cardiac: Regular rate and rhythm.  Neuro: CN II-XII grossly intact, gait normal. Extremities: No peripheral edema.  Strong peripheral pulses.  Mental Status:  No anxiety, nor agitation. Logical though process. Mild depression Skin: Warm and dry.  Assessment & Plan: Melanie Fritz was seen today for f/u anxiety.  Diagnoses and associated orders for this visit:  Anxiety - venlafaxine XR (EFFEXOR XR) 75 MG 24 hr capsule; One by mouth every morning for five days then two by mouth every morning thereafter.  Depression    Anxiety: Well-controlled however I believe depression from adjusting from her father's death is negatively impacting quality of life therefore restart Effexor that she stopped in the spring.  Contract for safety, return in 1 month if no improvement otherwise quality of life is significantly improved return in 3 months  Return in about 4 weeks (around 04/17/2013).

## 2013-05-15 ENCOUNTER — Encounter: Payer: Self-pay | Admitting: Family Medicine

## 2013-05-15 ENCOUNTER — Ambulatory Visit (INDEPENDENT_AMBULATORY_CARE_PROVIDER_SITE_OTHER): Payer: 59 | Admitting: Family Medicine

## 2013-05-15 VITALS — BP 122/86 | HR 92 | Wt 129.0 lb

## 2013-05-15 DIAGNOSIS — F329 Major depressive disorder, single episode, unspecified: Secondary | ICD-10-CM

## 2013-05-15 DIAGNOSIS — F3289 Other specified depressive episodes: Secondary | ICD-10-CM

## 2013-05-15 DIAGNOSIS — F419 Anxiety disorder, unspecified: Secondary | ICD-10-CM

## 2013-05-15 DIAGNOSIS — F32A Depression, unspecified: Secondary | ICD-10-CM

## 2013-05-15 DIAGNOSIS — F411 Generalized anxiety disorder: Secondary | ICD-10-CM

## 2013-05-15 MED ORDER — ESCITALOPRAM OXALATE 5 MG PO TABS: 5.0000 mg | ORAL_TABLET | Freq: Every day | ORAL | Status: DC

## 2013-05-15 MED ORDER — CLONAZEPAM 0.5 MG PO TABS: 0.2500 mg | ORAL_TABLET | Freq: Three times a day (TID) | ORAL | Status: DC | PRN

## 2013-05-15 NOTE — Progress Notes (Signed)
CC: Melanie Fritz is a 28 y.o. female is here for Anxiety/depression   Subjective: HPI:  Followup anxiety: She continues to take Klonopin 1-2 times a day states that this helps tremendously with sudden fluctuations and anxiety. Anxiety was greatly improved while on Effexor and was improved within one week after starting Effexor. Since stopping the medication described below she's had mild exacerbation of her daily anxiety described as states above worrying about anything and everything.  Follow depression: States depression was mild absent on a daily basis while on Effexor however the longer she was on this medication the worse a gradual daily fatigue began to bother her. Symptoms were getting in the way of her work. She tried taking Effexor only at night which did not improve or worsen her fatigue. She stopped this medication one week ago has had slight improvement of fatigue but worsening of depression. Describes depression symptoms as lack of motivation to get out of the house and extreme dissatisfaction with her job. Denies thoughts wanting to harm herself or others   Review Of Systems Outlined In HPI  Past Medical History  Diagnosis Date  . Anxiety 04/13/2012     Family History  Problem Relation Age of Onset  . Diabetes Father   . Leukemia    . Heart attack      grandfather  . Hypertension Mother   . Stroke      grandfather     History  Substance Use Topics  . Smoking status: Former Smoker    Quit date: 10/13/2010  . Smokeless tobacco: Not on file  . Alcohol Use: Yes     Objective: Filed Vitals:   05/15/13 1039  BP: 122/86  Pulse: 92    Vital signs reviewed. General: Alert and Oriented, No Acute Distress HEENT: Pupils equal, round, reactive to light. Conjunctivae clear.  External ears unremarkable.  Moist mucous membranes. Lungs: Clear and comfortable work of breathing, speaking in full sentences without accessory muscle use. Cardiac: Regular rate and rhythm.   Neuro: CN II-XII grossly intact, gait normal. Extremities: No peripheral edema.  Strong peripheral pulses.  Mental Status: Mild depression but no anxiety, nor agitation. Logical though process. Skin: Warm and dry.  Assessment & Plan: Melanie Fritz was seen today for anxiety/depression.  Diagnoses and associated orders for this visit:  Anxiety - clonazePAM (KLONOPIN) 0.5 MG tablet; Take 0.5 tablets (0.25 mg total) by mouth 3 (three) times daily as needed for anxiety.  Depression - escitalopram (LEXAPRO) 5 MG tablet; Take 1 tablet (5 mg total) by mouth daily.    Depression: Uncontrolled chronic condition, continue off of Effexor instead start low-dose Lexapro to followup one month Anxiety: Uncontrolled chronic condition: Starting Lexapro as above however continue Klonopin only as needed   Return in about 4 weeks (around 06/12/2013).

## 2013-06-01 ENCOUNTER — Emergency Department (INDEPENDENT_AMBULATORY_CARE_PROVIDER_SITE_OTHER)
Admission: EM | Admit: 2013-06-01 | Discharge: 2013-06-01 | Disposition: A | Discharge status: Home / Self Care | Payer: 59 | Source: Home / Self Care | Attending: Family Medicine | Admitting: Family Medicine

## 2013-06-01 ENCOUNTER — Encounter: Payer: Self-pay | Admitting: Emergency Medicine

## 2013-06-01 DIAGNOSIS — R059 Cough, unspecified: Secondary | ICD-10-CM

## 2013-06-01 DIAGNOSIS — J101 Influenza due to other identified influenza virus with other respiratory manifestations: Secondary | ICD-10-CM

## 2013-06-01 DIAGNOSIS — J111 Influenza due to unidentified influenza virus with other respiratory manifestations: Secondary | ICD-10-CM

## 2013-06-01 DIAGNOSIS — R05 Cough: Secondary | ICD-10-CM

## 2013-06-01 LAB — POCT INFLUENZA A/B
Influenza A, POC: POSITIVE
Influenza B, POC: NEGATIVE

## 2013-06-01 MED ORDER — OSELTAMIVIR PHOSPHATE 75 MG PO CAPS: 75.0000 mg | ORAL_CAPSULE | Freq: Two times a day (BID) | ORAL | Status: DC

## 2013-06-01 NOTE — ED Provider Notes (Signed)
CSN: [id number]     Arrival date & time 06/01/13  1031 History   First MD Initiated Contact with Patient 06/01/13 1031     Chief Complaint  Patient presents with  . Cough    HPI  URI Symptoms Onset: 2 days  Description: rhinorrhea, nasal congestion, cough  Modifying factors:  No flu shot this year   Symptoms Nasal discharge: yes Fever: yes Sore throat: no Cough: yes Wheezing: no Ear pain: no GI symptoms: no Sick contacts: unsure   Red Flags  Stiff neck: no Dyspnea: minimal  Rash: no Swallowing difficulty: no  Sinusitis Risk Factors Headache/face pain: no Double sickening: no tooth pain: no  Allergy Risk Factors Sneezing: no Itchy scratchy throat: no Seasonal symptoms: no  Flu Risk Factors Headache: no muscle aches: mild severe fatigue: mild   Past Medical History  Diagnosis Date  . Anxiety 04/13/2012   History reviewed. No pertinent past surgical history. Family History  Problem Relation Age of Onset  . Diabetes Father   . Leukemia    . Heart attack      grandfather  . Hypertension Mother   . Stroke      grandfather   History  Substance Use Topics  . Smoking status: Former Smoker -- 1 years    Quit date: 10/13/2010  . Smokeless tobacco: Not on file  . Alcohol Use: Yes   OB History   Grav Para Term Preterm Abortions TAB SAB Ect Mult Living                 Review of Systems  All other systems reviewed and are negative.    Allergies  Wellbutrin  Home Medications   Current Outpatient Rx  Name  Route  Sig  Dispense  Refill  . clonazePAM (KLONOPIN) 0.5 MG tablet   Oral   Take 0.5 tablets (0.25 mg total) by mouth 3 (three) times daily as needed for anxiety.   45 tablet   1   . escitalopram (LEXAPRO) 5 MG tablet   Oral   Take 1 tablet (5 mg total) by mouth daily.   30 tablet   2    BP 128/89  Pulse 103  Temp(Src) 98.5 F (36.9 C) (Oral)  Resp 16  Ht 5\' 3"  (1.6 m)  Wt 129 lb (58.514 kg)  BMI 22.86 kg/m2  SpO2 98%  LMP  04/08/2013 Physical Exam  Constitutional: She is oriented to person, place, and time. She appears well-developed and well-nourished.  HENT:  Head: Normocephalic and atraumatic.  Right Ear: External ear normal.  Left Ear: External ear normal.  +nasal erythema, rhinorrhea bilaterally, + post oropharyngeal erythema    Eyes: Conjunctivae are normal. Pupils are equal, round, and reactive to light.  Neck: Normal range of motion. Neck supple.  Cardiovascular: Normal rate and regular rhythm.   Pulmonary/Chest: Effort normal and breath sounds normal.  Abdominal: Soft.  Musculoskeletal: Normal range of motion.  Neurological: She is alert and oriented to person, place, and time.  Skin: Skin is warm.    ED Course  Procedures (including critical care time) Labs Review Labs Reviewed - No data to display Imaging Review No results found.    MDM   1. Cough   2. Influenza A    Flu A positive today Will place on tamiflu for symptomatic treatment.  Overall reassuring resp exam Discussed infectious and resp red flags at length  Follow up as needed.     The patient and/or caregiver has been counseled  thoroughly with regard to treatment plan and/or medications prescribed including dosage, schedule, interactions, rationale for use, and possible side effects and they verbalize understanding. Diagnoses and expected course of recovery discussed and will return if not improved as expected or if the condition worsens. Patient and/or caregiver verbalized understanding.         Doree Albee, MD 06/01/13 1100

## 2013-06-01 NOTE — ED Notes (Signed)
[name] c/o productive cough, congestion and sneezing with low grade fever last night x 2 days. No flu vac this season.

## 2013-07-11 ENCOUNTER — Telehealth: Payer: Self-pay | Admitting: Family Medicine

## 2013-07-11 DIAGNOSIS — F32A Depression, unspecified: Secondary | ICD-10-CM

## 2013-07-11 DIAGNOSIS — F329 Major depressive disorder, single episode, unspecified: Secondary | ICD-10-CM

## 2013-07-11 MED ORDER — ESCITALOPRAM OXALATE 5 MG PO TABS: 5.0000 mg | ORAL_TABLET | Freq: Every day | ORAL | Status: DC

## 2013-07-11 NOTE — Telephone Encounter (Signed)
REquesting 90 day supply Needs f/u for future refills

## 2013-07-25 ENCOUNTER — Ambulatory Visit (INDEPENDENT_AMBULATORY_CARE_PROVIDER_SITE_OTHER): Payer: 59 | Admitting: Family Medicine

## 2013-07-25 ENCOUNTER — Encounter: Payer: Self-pay | Admitting: Family Medicine

## 2013-07-25 VITALS — BP 120/84 | HR 77 | Wt 130.0 lb

## 2013-07-25 DIAGNOSIS — F32A Depression, unspecified: Secondary | ICD-10-CM

## 2013-07-25 DIAGNOSIS — N760 Acute vaginitis: Secondary | ICD-10-CM

## 2013-07-25 DIAGNOSIS — F3289 Other specified depressive episodes: Secondary | ICD-10-CM

## 2013-07-25 DIAGNOSIS — F329 Major depressive disorder, single episode, unspecified: Secondary | ICD-10-CM

## 2013-07-25 DIAGNOSIS — B9689 Other specified bacterial agents as the cause of diseases classified elsewhere: Secondary | ICD-10-CM

## 2013-07-25 DIAGNOSIS — F419 Anxiety disorder, unspecified: Secondary | ICD-10-CM

## 2013-07-25 DIAGNOSIS — A499 Bacterial infection, unspecified: Secondary | ICD-10-CM

## 2013-07-25 DIAGNOSIS — F411 Generalized anxiety disorder: Secondary | ICD-10-CM

## 2013-07-25 MED ORDER — CLONAZEPAM 0.5 MG PO TABS: 0.2500 mg | ORAL_TABLET | Freq: Three times a day (TID) | ORAL | Status: DC | PRN

## 2013-07-25 MED ORDER — METRONIDAZOLE 500 MG PO TABS
500.0000 mg | ORAL_TABLET | Freq: Three times a day (TID) | ORAL | Status: DC
Start: 2013-07-25 — End: 2014-03-13

## 2013-07-25 MED ORDER — ESCITALOPRAM OXALATE 5 MG PO TABS: 5.0000 mg | ORAL_TABLET | Freq: Every day | ORAL | Status: DC

## 2013-07-25 NOTE — Progress Notes (Signed)
CC: Melanie Fritz is a 28 y.o. female is here for f/u anxiety   Subjective: HPI:  Followup depression and anxiety: States that weeks after taking Lexapro she depression is now nonexistent. She takes it on a daily basis at night without any known side effects. She still gets occasional anxiety which is mild in severity usually only at work response 100% to as needed Klonopin. Denies any known side effects from this medication either. She is quite happy with her current quality of life physically and from a mental standpoint.  Denies thoughts wanting to harm self or others.  Complains of foul odor and mild discharge vaginally that has been present for the past week since she stopped her period. Denies any irritation or pain. Denies any abnormal bleeding. There been no fevers, chills, nor pelvic discomfort   Review Of Systems Outlined In HPI  Past Medical History  Diagnosis Date  . Anxiety 04/13/2012    No past surgical history on file. Family History  Problem Relation Age of Onset  . Diabetes Father   . Leukemia    . Heart attack      grandfather  . Hypertension Mother   . Stroke      grandfather    History   Social History  . Marital Status: Single    Spouse Name: N/A    Number of Children: N/A  . Years of Education: N/A   Occupational History  . Not on file.   Social History Main Topics  . Smoking status: Former Smoker -- 1 years    Quit date: 10/13/2010  . Smokeless tobacco: Not on file  . Alcohol Use: Yes  . Drug Use: No  . Sexual Activity: Yes   Other Topics Concern  . Not on file   Social History Narrative  . No narrative on file     Objective: BP 120/84  Pulse 77  Wt 130 lb (58.968 kg)  Vital signs reviewed. General: Alert and Oriented, No Acute Distress HEENT: Pupils equal, round, reactive to light. Conjunctivae clear.  External ears unremarkable.  Moist mucous membranes. Lungs: Clear and comfortable work of breathing, speaking in full sentences  without accessory muscle use. Cardiac: Regular rate and rhythm.  Extremities: No peripheral edema.  Strong peripheral pulses.  Mental Status: No depression, anxiety, nor agitation. Logical though process. Skin: Warm and dry.  Assessment & Plan: [name] was seen today for f/u anxiety.  Diagnoses and associated orders for this visit:  Depression - escitalopram (LEXAPRO) 5 MG tablet; Take 1 tablet (5 mg total) by mouth daily.  Anxiety - clonazePAM (KLONOPIN) 0.5 MG tablet; Take 0.5 tablets (0.25 mg total) by mouth 3 (three) times daily as needed for anxiety.  Bacterial vaginosis - metroNIDAZOLE (FLAGYL) 500 MG tablet; Take 1 tablet (500 mg total) by mouth 3 (three) times daily.    Anxiety and depression controlled with Klonopin and Lexapro Bacterial vaginosis: She gets this frequently after her periods which responds well to oral metronidazole, followup if genitourinary complaints do not respond to above   Return in about 6 months (around 01/24/2014).

## 2014-03-13 ENCOUNTER — Encounter: Payer: Self-pay | Admitting: Family Medicine

## 2014-03-13 ENCOUNTER — Ambulatory Visit (INDEPENDENT_AMBULATORY_CARE_PROVIDER_SITE_OTHER): Payer: BC Managed Care – PPO | Admitting: Family Medicine

## 2014-03-13 VITALS — BP 120/84 | HR 80 | Wt 145.0 lb

## 2014-03-13 DIAGNOSIS — R41840 Attention and concentration deficit: Secondary | ICD-10-CM

## 2014-03-13 DIAGNOSIS — F419 Anxiety disorder, unspecified: Secondary | ICD-10-CM

## 2014-03-13 DIAGNOSIS — F329 Major depressive disorder, single episode, unspecified: Secondary | ICD-10-CM | POA: Diagnosis not present

## 2014-03-13 DIAGNOSIS — B9689 Other specified bacterial agents as the cause of diseases classified elsewhere: Secondary | ICD-10-CM

## 2014-03-13 DIAGNOSIS — A499 Bacterial infection, unspecified: Secondary | ICD-10-CM

## 2014-03-13 DIAGNOSIS — N760 Acute vaginitis: Secondary | ICD-10-CM | POA: Diagnosis not present

## 2014-03-13 DIAGNOSIS — F32A Depression, unspecified: Secondary | ICD-10-CM

## 2014-03-13 MED ORDER — METRONIDAZOLE 500 MG PO TABS: 500.0000 mg | ORAL_TABLET | Freq: Three times a day (TID) | ORAL | Status: DC

## 2014-03-13 MED ORDER — AMPHETAMINE-DEXTROAMPHETAMINE 10 MG PO TABS: 10.0000 mg | ORAL_TABLET | Freq: Two times a day (BID) | ORAL | Status: DC | PRN

## 2014-03-13 NOTE — Progress Notes (Signed)
CC: Melanie Fritz is a 28 y.o. female is here for anxiety/depression f/u   Subjective: HPI:  Anxiety: patient reports that since I saw her last about 3-4 months ago she tapered herself off of Lexapro. Since then she's been using Klonopin 1-2 times a week it sounds like for occasional anxiety but she believes her anxiety is extremely lower than it was in March. She denies any intolerance to Lexapro.  Follow-up depression: Since being off of Lexapro she denies any depression whatsoever. She denies any other mental disturbance other than concentration difficulty.  She recently got a new job about 3 months ago and since starting this job she's had difficulty with multitasking, prioritizing, and avoiding distractions both at work and someone at home. Symptoms are moderate in severity at work occurring on a daily basis and are affecting her continued employment. She had similar issues as a child per her mother's report.  She complains of a foul odor vaginal discharge with an appearance and sent identical to prior infections with bacterial vaginosis that always seem to occur after her period. She denies any other genitourinary complaints  Review Of Systems Outlined In HPI  Past Medical History  Diagnosis Date  . Anxiety 04/13/2012    No past surgical history on file. Family History  Problem Relation Age of Onset  . Diabetes Father   . Leukemia    . Heart attack      grandfather  . Hypertension Mother   . Stroke      grandfather    History   Social History  . Marital Status: Single    Spouse Name: N/A    Number of Children: N/A  . Years of Education: N/A   Occupational History  . Not on file.   Social History Main Topics  . Smoking status: Former Smoker -- 1 years    Quit date: 10/13/2010  . Smokeless tobacco: Not on file  . Alcohol Use: Yes  . Drug Use: No  . Sexual Activity: Yes   Other Topics Concern  . Not on file   Social History Narrative     Objective: BP 120/84  mmHg  Pulse 80  Wt 145 lb (65.772 kg)  Vital signs reviewed. General: Alert and Oriented, No Acute Distress HEENT: Pupils equal, round, reactive to light. Conjunctivae clear.  External ears unremarkable.  Moist mucous membranes. Lungs: Clear and comfortable work of breathing, speaking in full sentences without accessory muscle use. Cardiac: Regular rate and rhythm.  Neuro: CN II-XII grossly intact, gait normal. Extremities: No peripheral edema.  Strong peripheral pulses.  Mental Status: No depression, anxiety, nor agitation. Logical though process. Skin: Warm and dry.  Assessment & Plan: Melanie Fritz was seen today for anxiety/depression f/u.  Diagnoses and associated orders for this visit:  Anxiety  Depression  Poor concentration - amphetamine-dextroamphetamine (ADDERALL) 10 MG tablet; Take 1 tablet (10 mg total) by mouth 2 (two) times daily as needed (Attention Problems).  Bacterial vaginosis - metroNIDAZOLE (FLAGYL) 500 MG tablet; Take 1 tablet (500 mg total) by mouth 3 (three) times daily.    Anxiety: Controlled with as needed and sparing use of clonazepam Depression: Controlled no current intervention needed Poor concentration: Start Adderall, discussed that she'll need to take this on an as-needed basis but no more than 2 doses a day. Also discussed that this may increase her anxiety and we might have to stop this if her use of clonazepam increases Bacterial vaginosis: Start Flagyl   Return for attention.

## 2014-04-12 ENCOUNTER — Ambulatory Visit (INDEPENDENT_AMBULATORY_CARE_PROVIDER_SITE_OTHER): Payer: BC Managed Care – PPO | Admitting: Family Medicine

## 2014-04-12 ENCOUNTER — Encounter: Payer: Self-pay | Admitting: Family Medicine

## 2014-04-12 ENCOUNTER — Encounter: Payer: Self-pay | Admitting: Sports Medicine

## 2014-04-12 ENCOUNTER — Encounter (INDEPENDENT_AMBULATORY_CARE_PROVIDER_SITE_OTHER): Payer: BC Managed Care – PPO | Admitting: Sports Medicine

## 2014-04-12 VITALS — BP 106/69 | HR 83 | Ht 63.0 in | Wt 140.0 lb

## 2014-04-12 DIAGNOSIS — F988 Other specified behavioral and emotional disorders with onset usually occurring in childhood and adolescence: Secondary | ICD-10-CM

## 2014-04-12 DIAGNOSIS — F909 Attention-deficit hyperactivity disorder, unspecified type: Secondary | ICD-10-CM | POA: Diagnosis not present

## 2014-04-12 MED ORDER — AMPHETAMINE-DEXTROAMPHET ER 20 MG PO CP24: 20.0000 mg | ORAL_CAPSULE | ORAL | Status: DC

## 2014-04-12 NOTE — Progress Notes (Signed)
CC: Melanie Fritz is a 28 y.o. female is here for Follow-up   Subjective: HPI:  Follow-up poor concentration.: Since starting Adderall she   has noticed significant improvement with no longer having distractibility and now having improved multitasking at work. Her position is no longer in jeopardy. She is noticing that it helps the above symptoms within 30 minutes after taking it however it wears off 3-4 hours after taking a dose. She is only taking 2 doses a day however her shifts are ranging from 12-14 hours a day now. She dozes at the end of her shifts she begins to struggle with the original above symptoms. She denies any known side effects. She specifically denies appetite suppression, sleep disturbance, anxiety or paranoia.  Review Of Systems Outlined In HPI  Past Medical History  Diagnosis Date  . Anxiety 04/13/2012    No past surgical history on file. Family History  Problem Relation Age of Onset  . Diabetes Father   . Leukemia    . Heart attack      grandfather  . Hypertension Mother   . Stroke      grandfather    History   Social History  . Marital Status: Single    Spouse Name: N/A    Number of Children: N/A  . Years of Education: N/A   Occupational History  . Not on file.   Social History Main Topics  . Smoking status: Former Smoker -- 1 years    Quit date: 10/13/2010  . Smokeless tobacco: Not on file  . Alcohol Use: Yes  . Drug Use: No  . Sexual Activity: Yes   Other Topics Concern  . Not on file   Social History Narrative     Objective: BP 106/69 mmHg  Pulse 83  Ht 5\' 3"  (1.6 m)  Wt 140 lb (63.504 kg)  BMI 24.81 kg/m2  Vital signs reviewed. General: Alert and Oriented, No Acute Distress HEENT: Pupils equal, round, reactive to light. Conjunctivae clear.  External ears unremarkable.  Moist mucous membranes. Lungs: Clear and comfortable work of breathing, speaking in full sentences without accessory muscle use. Cardiac: Regular rate and rhythm.   Neuro: CN II-XII grossly intact, gait normal. Extremities: No peripheral edema.  Strong peripheral pulses.  Mental Status: No depression, anxiety, nor agitation. Logical though process. Skin: Warm and dry.  Assessment & Plan: Melanie Fritz was seen today for follow-up.  Diagnoses and associated orders for this visit:  ADD (attention deficit disorder) - amphetamine-dextroamphetamine (ADDERALL XR) 20 MG 24 hr capsule; Take 1 capsule (20 mg total) by mouth every morning.     ADD: Improved, there is still room for improvement given how long her shifts are. We will try the extended release formulation of Adderall and if not beneficial or not covered by insurance next step would be to switch to 10 mg 3 times a day only on days that she is working. If the XL formulation is working well for her she can call for refills in one month in face-to-face follow-up in 3 months. If it is not working well we will switch to 3 times a day with follow-up in one month.  Return in about 3 months (around 07/12/2014).

## 2014-04-13 NOTE — Progress Notes (Signed)
This encounter was created in error - please disregard.

## 2014-05-14 ENCOUNTER — Other Ambulatory Visit: Payer: Self-pay | Admitting: *Deleted

## 2014-05-14 DIAGNOSIS — F988 Other specified behavioral and emotional disorders with onset usually occurring in childhood and adolescence: Secondary | ICD-10-CM

## 2014-05-14 MED ORDER — AMPHETAMINE-DEXTROAMPHET ER 20 MG PO CP24: 20.0000 mg | ORAL_CAPSULE | ORAL | Status: DC

## 2014-06-14 ENCOUNTER — Other Ambulatory Visit: Payer: Self-pay | Admitting: Family Medicine

## 2014-06-14 DIAGNOSIS — F988 Other specified behavioral and emotional disorders with onset usually occurring in childhood and adolescence: Secondary | ICD-10-CM

## 2014-06-14 MED ORDER — AMPHETAMINE-DEXTROAMPHET ER 20 MG PO CP24: 20.0000 mg | ORAL_CAPSULE | ORAL | Status: DC

## 2014-07-13 ENCOUNTER — Ambulatory Visit (INDEPENDENT_AMBULATORY_CARE_PROVIDER_SITE_OTHER): Payer: BLUE CROSS/BLUE SHIELD | Admitting: Family Medicine

## 2014-07-13 ENCOUNTER — Encounter: Payer: Self-pay | Admitting: Family Medicine

## 2014-07-13 VITALS — BP 116/79 | HR 74 | Ht 63.0 in | Wt 142.0 lb

## 2014-07-13 DIAGNOSIS — F909 Attention-deficit hyperactivity disorder, unspecified type: Secondary | ICD-10-CM | POA: Insufficient documentation

## 2014-07-13 DIAGNOSIS — F900 Attention-deficit hyperactivity disorder, predominantly inattentive type: Secondary | ICD-10-CM | POA: Diagnosis not present

## 2014-07-13 DIAGNOSIS — F988 Other specified behavioral and emotional disorders with onset usually occurring in childhood and adolescence: Secondary | ICD-10-CM

## 2014-07-13 MED ORDER — AMPHETAMINE-DEXTROAMPHET ER 20 MG PO CP24: 20.0000 mg | ORAL_CAPSULE | ORAL | Status: DC

## 2014-07-13 NOTE — Progress Notes (Signed)
CC: Melanie Fritz is a 29 y.o. female is here for Follow-up   Subjective: HPI:  Follow-up ADD: Since I saw her last she's been taking Adderall XR 20 mg daily. She is wondering if she is getting the headache as a side effect. It happens only on the days that she is at work. It occurs around 2 in the afternoon. She usually takes her dose at 8 in the morning. Headache will go away after 2 hours. Will also help if she takes ibuprofen. She denies any other motor or sensory disturbances. She believes that the extended release formulation is much better than the immediate release formulation. It's helping her go through the entire work day without any distractibility or difficulty completing job responsibilities. She notices she is better at prioritizing on the days that she takes this medication. Denies sleep disturbance, anxiety, nor any other mental disturbance.   Review Of Systems Outlined In HPI  Past Medical History  Diagnosis Date  . Anxiety 04/13/2012    No past surgical history on file. Family History  Problem Relation Age of Onset  . Diabetes Father   . Leukemia    . Heart attack      grandfather  . Hypertension Mother   . Stroke      grandfather    History   Social History  . Marital Status: Single    Spouse Name: N/A  . Number of Children: N/A  . Years of Education: N/A   Occupational History  . Not on file.   Social History Main Topics  . Smoking status: Former Smoker -- 1 years    Quit date: 10/13/2010  . Smokeless tobacco: Not on file  . Alcohol Use: Yes  . Drug Use: No  . Sexual Activity: Yes   Other Topics Concern  . Not on file   Social History Narrative     Objective: BP 116/79 mmHg  Pulse 74  Ht 5\' 3"  (1.6 m)  Wt 142 lb (64.411 kg)  BMI 25.16 kg/m2  Vital signs reviewed. General: Alert and Oriented, No Acute Distress HEENT: Pupils equal, round, reactive to light. Conjunctivae clear.  External ears unremarkable.  Moist mucous membranes. Lungs:  Clear and comfortable work of breathing, speaking in full sentences without accessory muscle use. Cardiac: Regular rate and rhythm.  Neuro: CN II-XII grossly intact, gait normal. Extremities: No peripheral edema.  Strong peripheral pulses.  Mental Status: No depression, anxiety, nor agitation. Logical though process. Skin: Warm and dry.  Assessment & Plan: Melanie Fritz was seen today for follow-up.  Diagnoses and all orders for this visit:  Adult ADHD  ADD (attention deficit disorder) Orders: -     Discontinue: amphetamine-dextroamphetamine (ADDERALL XR) 20 MG 24 hr capsule; Take 1 capsule (20 mg total) by mouth every morning. -     Discontinue: amphetamine-dextroamphetamine (ADDERALL XR) 20 MG 24 hr capsule; Take 1 capsule (20 mg total) by mouth every morning. -     amphetamine-dextroamphetamine (ADDERALL XR) 20 MG 24 hr capsule; Take 1 capsule (20 mg total) by mouth every morning.   Adult ADHD: Controlled continue excessive release correlation of Adderall. Low suspicion that her headaches are due to a side effect. I've asked her to go see an optometrist to check her vision since her headaches are not present on the days that she is not at work and she sits in front of a computer all day long her job.  Return in about 3 months (around 10/13/2014).

## 2014-10-12 ENCOUNTER — Ambulatory Visit (INDEPENDENT_AMBULATORY_CARE_PROVIDER_SITE_OTHER): Payer: BLUE CROSS/BLUE SHIELD | Admitting: Family Medicine

## 2014-10-12 ENCOUNTER — Encounter: Payer: Self-pay | Admitting: Family Medicine

## 2014-10-12 VITALS — BP 110/80 | HR 83 | Wt 140.0 lb

## 2014-10-12 DIAGNOSIS — A499 Bacterial infection, unspecified: Secondary | ICD-10-CM | POA: Diagnosis not present

## 2014-10-12 DIAGNOSIS — F909 Attention-deficit hyperactivity disorder, unspecified type: Secondary | ICD-10-CM

## 2014-10-12 DIAGNOSIS — F900 Attention-deficit hyperactivity disorder, predominantly inattentive type: Secondary | ICD-10-CM

## 2014-10-12 DIAGNOSIS — N760 Acute vaginitis: Secondary | ICD-10-CM

## 2014-10-12 DIAGNOSIS — B9689 Other specified bacterial agents as the cause of diseases classified elsewhere: Secondary | ICD-10-CM

## 2014-10-12 MED ORDER — AMPHETAMINE-DEXTROAMPHET ER 25 MG PO CP24: 25.0000 mg | ORAL_CAPSULE | ORAL | Status: DC

## 2014-10-12 MED ORDER — METRONIDAZOLE 0.75 % VA GEL: VAGINAL | Status: DC

## 2014-10-12 NOTE — Progress Notes (Signed)
CC: Melanie Fritz is a 29 y.o. female is here for Follow-up   Subjective: HPI:  Follow-up ADD: Continues to take Adderall 20 mg every morning. She feels like around 1 or 2:00 in the afternoon she started to get distracted and has difficulty following a single task. Symptoms are interfering with work productivity to a moderate degree. Symptoms are present on a daily basis but not when at home. She is working 6 days a week right now. Headaches that she described last time I saw her have now resolved. She denies any known side effects. She denies any appetite suppression, weight loss or anxiety.  One or 2 days after every menstrual cycle she gets foul-smelling odor and vaginal discharge that she has been treating with oral metronidazole. Symptoms disappear within 1 or 2 days after taking the metronidazole. She's tried eating yogurt and probiotics but this is not prevented the symptoms. She has had a return of the symptoms above. She denies any other genitourinary complaints.   Review Of Systems Outlined In HPI  Past Medical History  Diagnosis Date  . Anxiety 04/13/2012    No past surgical history on file. Family History  Problem Relation Age of Onset  . Diabetes Father   . Leukemia    . Heart attack      grandfather  . Hypertension Mother   . Stroke      grandfather    History   Social History  . Marital Status: Single    Spouse Name: N/A  . Number of Children: N/A  . Years of Education: N/A   Occupational History  . Not on file.   Social History Main Topics  . Smoking status: Former Smoker -- 1 years    Quit date: 10/13/2010  . Smokeless tobacco: Not on file  . Alcohol Use: Yes  . Drug Use: No  . Sexual Activity: Yes   Other Topics Concern  . Not on file   Social History Narrative     Objective: BP 110/80 mmHg  Pulse 83  Wt 140 lb (63.504 kg)  Vital signs reviewed. General: Alert and Oriented, No Acute Distress HEENT: Pupils equal, round, reactive to light.  Conjunctivae clear.  External ears unremarkable.  Moist mucous membranes. Lungs: Clear and comfortable work of breathing, speaking in full sentences without accessory muscle use. Cardiac: Regular rate and rhythm.  Neuro: CN II-XII grossly intact, gait normal. Extremities: No peripheral edema.  Strong peripheral pulses.  Mental Status: No depression, anxiety, nor agitation. Logical though process. Skin: Warm and dry. Assessment & Plan: Melanie Fritz was seen today for follow-up.  Diagnoses and all orders for this visit:  Adult ADHD  BV (bacterial vaginosis) Orders: -     metroNIDAZOLE (METROGEL) 0.75 % vaginal gel; One applicatorful delivered into vagina every evening for five days.  Other orders -     amphetamine-dextroamphetamine (ADDERALL XR) 25 MG 24 hr capsule; Take 1 capsule by mouth every morning.   Adult ADHD: Uncontrolled chronic condition slightly increasing Adderall. If this is not beneficial we can always go back to the 20 mg formulation with a small immediate release formulation to be taken in the afternoons. I have asked her to call me in one month if symptoms are controlled for refills of the next 2 months. Call me next week if symptoms are not improved. Bacterial vaginosis: Refilling as needed use of metronidazole, she would prefer gel applications as opposed to oral.     Return if symptoms worsen or fail to improve.

## 2014-11-13 ENCOUNTER — Encounter: Payer: Self-pay | Admitting: Family Medicine

## 2014-11-13 ENCOUNTER — Ambulatory Visit (INDEPENDENT_AMBULATORY_CARE_PROVIDER_SITE_OTHER): Payer: BLUE CROSS/BLUE SHIELD | Admitting: Family Medicine

## 2014-11-13 VITALS — BP 131/91 | HR 93 | Wt 138.0 lb

## 2014-11-13 DIAGNOSIS — F900 Attention-deficit hyperactivity disorder, predominantly inattentive type: Secondary | ICD-10-CM

## 2014-11-13 DIAGNOSIS — F909 Attention-deficit hyperactivity disorder, unspecified type: Secondary | ICD-10-CM

## 2014-11-13 MED ORDER — AMPHETAMINE-DEXTROAMPHETAMINE 10 MG PO TABS: ORAL_TABLET | ORAL | Status: DC

## 2014-11-13 MED ORDER — AMPHETAMINE-DEXTROAMPHET ER 20 MG PO CP24: 20.0000 mg | ORAL_CAPSULE | ORAL | Status: DC

## 2014-11-13 NOTE — Progress Notes (Signed)
CC: Melanie Fritz is a 29 y.o. female is here for f/u adderall   Subjective: HPI:   Follow up ADHD: Continues to take 25 mg of Adderall XR every morning. Just like to 20 mg XRformulation she tells me doesn't contest at job with helping her attention for the first 5 or 6 hours of the day. Sometime after lunch she notes a significant decline in her ability to focus and focus on one task at a time. She gets distracted easily sometime after 1 or 2:00 in the afternoon. This did not get any better since starting the higher dose of the XR formulation. She denies any appetite suppression or any other side effects. She denies any anxiety or depression. No irregular heartbeat or mental disturbance.   Review Of Systems Outlined In HPI  Past Medical History  Diagnosis Date  . Anxiety 04/13/2012    No past surgical history on file. Family History  Problem Relation Age of Onset  . Diabetes Father   . Leukemia    . Heart attack      grandfather  . Hypertension Mother   . Stroke      grandfather    History   Social History  . Marital Status: Single    Spouse Name: N/A  . Number of Children: N/A  . Years of Education: N/A   Occupational History  . Not on file.   Social History Main Topics  . Smoking status: Former Smoker -- 1 years    Quit date: 10/13/2010  . Smokeless tobacco: Not on file  . Alcohol Use: Yes  . Drug Use: No  . Sexual Activity: Yes   Other Topics Concern  . Not on file   Social History Narrative     Objective: BP 131/91 mmHg  Pulse 93  Wt 138 lb (62.596 kg)  Vital signs reviewed. General: Alert and Oriented, No Acute Distress HEENT: Pupils equal, round, reactive to light. Conjunctivae clear.  External ears unremarkable.  Moist mucous membranes. Lungs: Clear and comfortable work of breathing, speaking in full sentences without accessory muscle use. Cardiac: Regular rate and rhythm.  Neuro: CN II-XII grossly intact, gait normal. Extremities: No peripheral  edema.  Strong peripheral pulses.  Mental Status: No depression, anxiety, nor agitation. Logical though process. Skin: Warm and dry.  Assessment & Plan: [name] was seen today for f/u adderall.  Diagnoses and all orders for this visit:  Adult ADHD  Other orders -     amphetamine-dextroamphetamine (ADDERALL XR) 20 MG 24 hr capsule; Take 1 capsule (20 mg total) by mouth every morning. -     amphetamine-dextroamphetamine (ADDERALL) 10 MG tablet; One by mouth every afternoon only as needed for attention.  Adult ADHD: Uncontrolled, return to the 20 mg extended release formulation and begin a low-dose afternoon immediate release dose on an as-needed basis. If symptoms are controlled follow-up in 3 months. If insurance will not cover this plan she has the option of pain for the immediate release out of pocket or we can switch to 10 mg immediate release 3 times a day as needed.  No Follow-up on file.

## 2014-12-10 ENCOUNTER — Other Ambulatory Visit: Payer: Self-pay | Admitting: Family Medicine

## 2014-12-12 MED ORDER — AMPHETAMINE-DEXTROAMPHET ER 20 MG PO CP24: 20.0000 mg | ORAL_CAPSULE | ORAL | Status: DC

## 2014-12-12 MED ORDER — AMPHETAMINE-DEXTROAMPHETAMINE 10 MG PO TABS: ORAL_TABLET | ORAL | Status: DC

## 2014-12-12 NOTE — Addendum Note (Signed)
Addended by: Pixie Casino on: 12/12/2014 01:03 PM   Modules accepted: Orders

## 2015-01-08 ENCOUNTER — Other Ambulatory Visit: Payer: Self-pay | Admitting: Family Medicine

## 2015-01-09 ENCOUNTER — Other Ambulatory Visit: Payer: Self-pay

## 2015-01-09 MED ORDER — AMPHETAMINE-DEXTROAMPHET ER 20 MG PO CP24: 20.0000 mg | ORAL_CAPSULE | ORAL | Status: DC

## 2015-01-09 MED ORDER — AMPHETAMINE-DEXTROAMPHETAMINE 10 MG PO TABS: ORAL_TABLET | ORAL | Status: DC

## 2015-01-09 NOTE — Addendum Note (Signed)
Addended by: Minna Antis T on: 01/09/2015 11:40 AM   Modules accepted: Orders

## 2015-01-22 ENCOUNTER — Encounter: Payer: Self-pay | Admitting: Family Medicine

## 2015-01-23 MED ORDER — METRONIDAZOLE 0.75 % VA GEL: VAGINAL | Status: DC

## 2015-02-07 ENCOUNTER — Other Ambulatory Visit: Payer: Self-pay | Admitting: Family Medicine

## 2015-02-07 MED ORDER — AMPHETAMINE-DEXTROAMPHETAMINE 10 MG PO TABS: ORAL_TABLET | ORAL | Status: DC

## 2015-02-07 MED ORDER — AMPHETAMINE-DEXTROAMPHET ER 20 MG PO CP24: 20.0000 mg | ORAL_CAPSULE | ORAL | Status: DC

## 2015-03-06 ENCOUNTER — Encounter: Payer: Self-pay | Admitting: Family Medicine

## 2015-03-06 ENCOUNTER — Ambulatory Visit (INDEPENDENT_AMBULATORY_CARE_PROVIDER_SITE_OTHER): Payer: BLUE CROSS/BLUE SHIELD | Admitting: Family Medicine

## 2015-03-06 VITALS — BP 117/83 | HR 92 | Wt 137.0 lb

## 2015-03-06 DIAGNOSIS — N760 Acute vaginitis: Secondary | ICD-10-CM | POA: Diagnosis not present

## 2015-03-06 DIAGNOSIS — F900 Attention-deficit hyperactivity disorder, predominantly inattentive type: Secondary | ICD-10-CM | POA: Diagnosis not present

## 2015-03-06 DIAGNOSIS — B9689 Other specified bacterial agents as the cause of diseases classified elsewhere: Secondary | ICD-10-CM

## 2015-03-06 DIAGNOSIS — A499 Bacterial infection, unspecified: Secondary | ICD-10-CM

## 2015-03-06 DIAGNOSIS — F909 Attention-deficit hyperactivity disorder, unspecified type: Secondary | ICD-10-CM

## 2015-03-06 MED ORDER — AMPHETAMINE-DEXTROAMPHETAMINE 10 MG PO TABS: ORAL_TABLET | ORAL | Status: DC

## 2015-03-06 MED ORDER — METRONIDAZOLE 0.75 % VA GEL: VAGINAL | Status: DC

## 2015-03-06 MED ORDER — AMPHETAMINE-DEXTROAMPHET ER 20 MG PO CP24: 20.0000 mg | ORAL_CAPSULE | ORAL | Status: DC

## 2015-03-06 NOTE — Progress Notes (Signed)
CC: Melanie Fritz is a 29 y.o. female is here for Medication Management   Subjective: HPI:  Follow-up ADHD: She is taking 20 mg of extended release Adderall every morning and around 1 or 2 in the afternoon and additional 10 mg immediate release. She's noticed that this has resolved her difficulty with inattentiveness and instructed debility later in the afternoon. She denies any known side effects other than a mild tremor in the hands which is not interfering with her quality of life but she wants to know if it could be due to the afternoon dosing. She denies any anxiety, appetite suppression or sleep disturbance. She denies any nonrestorative sleep. She denies any depression or anxiety.  She continues to get bacterial vaginosis after most menstrual cycles. She has tried different soaps, avoiding baths, eating yogurt nothing seems to make symptoms better or worse other than using metronidazole after her menses. She denies any genital or urinary symptoms today   Review Of Systems Outlined In HPI  Past Medical History  Diagnosis Date  . Anxiety 04/13/2012    No past surgical history on file. Family History  Problem Relation Age of Onset  . Diabetes Father   . Leukemia    . Heart attack      grandfather  . Hypertension Mother   . Stroke      grandfather    Social History   Social History  . Marital Status: Single    Spouse Name: N/A  . Number of Children: N/A  . Years of Education: N/A   Occupational History  . Not on file.   Social History Main Topics  . Smoking status: Former Smoker -- 1 years    Quit date: 10/13/2010  . Smokeless tobacco: Not on file  . Alcohol Use: Yes  . Drug Use: No  . Sexual Activity: Yes   Other Topics Concern  . Not on file   Social History Narrative     Objective: BP 117/83 mmHg  Pulse 92  Wt 137 lb (62.143 kg)  Vital signs reviewed. General: Alert and Oriented, No Acute Distress HEENT: Pupils equal, round, reactive to light.  Conjunctivae clear.  External ears unremarkable.  Moist mucous membranes. Lungs: Clear and comfortable work of breathing, speaking in full sentences without accessory muscle use. Cardiac: Regular rate and rhythm.  Neuro: CN II-XII grossly intact, gait normal. Extremities: No peripheral edema.  Strong peripheral pulses.  Mental Status: No depression, anxiety, nor agitation. Logical though process. Skin: Warm and dry. Assessment & Plan: Melanie Fritz was seen today for medication management.  Diagnoses and all orders for this visit:  Bacterial vaginosis  Adult ADHD  Other orders -     amphetamine-dextroamphetamine (ADDERALL XR) 20 MG 24 hr capsule; Take 1 capsule (20 mg total) by mouth every morning. Due for follow up appointment. -     amphetamine-dextroamphetamine (ADDERALL) 10 MG tablet; One by mouth every afternoon only as needed for attention. Due for follow up appointment. -     metroNIDAZOLE (METROGEL) 0.75 % vaginal gel; One applicatorful delivered into vagina every evening for five days.    GVHD: Controlled continue current Adderall regimen, discussed the tremor is a common side effect from this medication, she is okay with putting up with it knowing that it does not represent anything more serious.   bacterial vaginosis: Discussed trying a daily probiotic in tablet/capsule form. May continue to use metronidazole on an as-needed basis   Return in about 3 months (around 06/06/2015).

## 2015-04-04 ENCOUNTER — Other Ambulatory Visit: Payer: Self-pay | Admitting: Family Medicine

## 2015-04-05 MED ORDER — AMPHETAMINE-DEXTROAMPHET ER 20 MG PO CP24: 20.0000 mg | ORAL_CAPSULE | ORAL | Status: DC

## 2015-04-05 MED ORDER — AMPHETAMINE-DEXTROAMPHETAMINE 10 MG PO TABS: ORAL_TABLET | ORAL | Status: DC

## 2015-05-09 ENCOUNTER — Other Ambulatory Visit (HOSPITAL_COMMUNITY)
Admission: RE | Admit: 2015-05-09 | Discharge: 2015-05-09 | Disposition: A | Discharge status: Home / Self Care | Payer: BLUE CROSS/BLUE SHIELD | Source: Ambulatory Visit | Attending: Family Medicine | Admitting: Family Medicine

## 2015-05-09 ENCOUNTER — Encounter: Payer: Self-pay | Admitting: Family Medicine

## 2015-05-09 ENCOUNTER — Ambulatory Visit (INDEPENDENT_AMBULATORY_CARE_PROVIDER_SITE_OTHER): Payer: BLUE CROSS/BLUE SHIELD | Admitting: Family Medicine

## 2015-05-09 VITALS — BP 114/80 | HR 90 | Wt 134.0 lb

## 2015-05-09 DIAGNOSIS — F900 Attention-deficit hyperactivity disorder, predominantly inattentive type: Secondary | ICD-10-CM | POA: Diagnosis not present

## 2015-05-09 DIAGNOSIS — N63 Unspecified lump in breast: Secondary | ICD-10-CM | POA: Diagnosis not present

## 2015-05-09 DIAGNOSIS — N6320 Unspecified lump in the left breast, unspecified quadrant: Secondary | ICD-10-CM

## 2015-05-09 DIAGNOSIS — F909 Attention-deficit hyperactivity disorder, unspecified type: Secondary | ICD-10-CM

## 2015-05-09 DIAGNOSIS — Z124 Encounter for screening for malignant neoplasm of cervix: Secondary | ICD-10-CM

## 2015-05-09 DIAGNOSIS — Z01419 Encounter for gynecological examination (general) (routine) without abnormal findings: Secondary | ICD-10-CM | POA: Diagnosis not present

## 2015-05-09 MED ORDER — AMPHETAMINE-DEXTROAMPHET ER 20 MG PO CP24: 20.0000 mg | ORAL_CAPSULE | ORAL | Status: DC

## 2015-05-09 MED ORDER — AMPHETAMINE-DEXTROAMPHETAMINE 10 MG PO TABS: ORAL_TABLET | ORAL | Status: DC

## 2015-05-09 NOTE — Progress Notes (Signed)
CC: Melanie Fritz is a 30 y.o. female is here for Gynecologic Exam and lump under left breast   Subjective: HPI:   F/U ADHD: Taking a XR in the morning and IR of adderall in the afternoon is helping with inattention at work.  No interference with job performance and no known side effects.  denies appetite suppression, weight loss, difficulty with falling asleep or any mental disturbance. No anxiety.  . It's been 5 years since her last Pap smear. She is requesting a repeat today. She has no gynecological concerns other than recurrent BV infections that are treated at home with metronidazole. She does mention that she's noticed a tender lump in her left breast that seems to fluctuate around her menstrual cycle. She just stopped her cycle this past weekend and she is waiting for it to decrease. She denies any overlying skin changes nipple discharge or breast architectural changes other than that described above. No swollen lymph nodes and no family history of breast cancer.    Review Of Systems Outlined In HPI  Past Medical History  Diagnosis Date  . Anxiety 04/13/2012    No past surgical history on file. Family History  Problem Relation Age of Onset  . Diabetes Father   . Leukemia    . Heart attack      grandfather  . Hypertension Mother   . Stroke      grandfather    Social History   Social History  . Marital Status: Single    Spouse Name: N/A  . Number of Children: N/A  . Years of Education: N/A   Occupational History  . Not on file.   Social History Main Topics  . Smoking status: Former Smoker -- 1 years    Quit date: 10/13/2010  . Smokeless tobacco: Not on file  . Alcohol Use: Yes  . Drug Use: No  . Sexual Activity: Yes   Other Topics Concern  . Not on file   Social History Narrative     Objective: BP 114/80 mmHg  Pulse 90  Wt 134 lb (60.782 kg)  General: Alert and Oriented, No Acute Distress HEENT: Pupils equal, round, reactive to light. Conjunctivae  clear.   moist nevus membranes Lngs: Clear and comfortable work of breathing  Cardiac: Regular rate and rhythm. Breast: ([place] as chaperone for full exam) in the left breast there is no gross skin abnormalities, there is a small lump about the size of half a gr of rice in the 6:00 position at the inferior most portion of her breast. Slightly tender. Genitourinary: Urethra unremarkable, no external skin lesions of the pelvis. Vaginal canal is unremarkable without lesions. Cervix is unremarkable without any visual lesions. Extremities: No peripheral edema.  Strong peripheral pulses.  Mental Status: No depression, anxiety, nor agitation. Skin: Warm and dry.  Assessment & Plan: Ranell was seen today for gynecologic exam and lump under left breast.  Diagnoses and all orders for this visit:  Screening for malignant neoplasm of cervix -     Cytology - PAP  Adult ADHD  Left breast mass  Other orders -     amphetamine-dextroamphetamine (ADDERALL XR) 20 MG 24 hr capsule; Take 1 capsule (20 mg total) by mouth every morning. -     amphetamine-dextroamphetamine (ADDERALL) 10 MG tablet; One by mouth every afternoon only as needed for attention.   Pap smear obtained without difficulty I've asked her to contact me if the breast mass does not disappear and become painless after this week,  will readdress this also when she follows up for her three-month follow-up. If pain persists throughout next week she'll need a mammogram.   Return in about 3 months (around 08/07/2015).

## 2015-05-15 LAB — CYTOLOGY - PAP

## 2015-06-05 ENCOUNTER — Ambulatory Visit (INDEPENDENT_AMBULATORY_CARE_PROVIDER_SITE_OTHER): Payer: BLUE CROSS/BLUE SHIELD | Admitting: Family Medicine

## 2015-06-05 ENCOUNTER — Encounter: Payer: Self-pay | Admitting: Family Medicine

## 2015-06-05 VITALS — BP 116/78 | HR 110 | Wt 134.0 lb

## 2015-06-05 DIAGNOSIS — F909 Attention-deficit hyperactivity disorder, unspecified type: Secondary | ICD-10-CM

## 2015-06-05 DIAGNOSIS — N63 Unspecified lump in breast: Secondary | ICD-10-CM | POA: Diagnosis not present

## 2015-06-05 DIAGNOSIS — N6320 Unspecified lump in the left breast, unspecified quadrant: Secondary | ICD-10-CM

## 2015-06-05 DIAGNOSIS — F900 Attention-deficit hyperactivity disorder, predominantly inattentive type: Secondary | ICD-10-CM | POA: Diagnosis not present

## 2015-06-05 MED ORDER — AMPHETAMINE-DEXTROAMPHETAMINE 10 MG PO TABS: ORAL_TABLET | ORAL | Status: DC

## 2015-06-05 MED ORDER — AMPHETAMINE-DEXTROAMPHET ER 20 MG PO CP24: 20.0000 mg | ORAL_CAPSULE | ORAL | Status: DC

## 2015-06-05 NOTE — Progress Notes (Signed)
CC: Melanie Fritz is a 30 y.o. female is here for Medication Refill and Lump on left Breast   Subjective: HPI:   Follow-up left breast mass: She does not believe that the mass has grown or shrunk since she saw me last. Over the last month it's no longer painful to the touch. She denies any other masses or breast sensitivities with self breast exam. She denies any overlying skin changes at the site of swelling nor any recent or remote nipple retraction/discharge. No family history of breast cancer. Third degree relative with ovarian cancer but nobody closer in the family tree. She denies any swollen lymph nodes, night sweats, unintentional weight loss. Denies any fevers, chills or any other genitourinary complaints.  Follow-up on ADHD: She admits that she is not due for refill yet but would like to know if she can have a paper copy to fill once her current prescription is out. Medication is still helping her greatly at work with productivity and avoiding distractions. She denies any new side effects. Specifically denies any anxiety, paranoia, sleep disturbance or appetite suppression.   Review Of Systems Outlined In HPI  Past Medical History  Diagnosis Date  . Anxiety 04/13/2012    No past surgical history on file. Family History  Problem Relation Age of Onset  . Diabetes Father   . Leukemia    . Heart attack      grandfather  . Hypertension Mother   . Stroke      grandfather    Social History   Social History  . Marital Status: Single    Spouse Name: N/A  . Number of Children: N/A  . Years of Education: N/A   Occupational History  . Not on file.   Social History Main Topics  . Smoking status: Former Smoker -- 1 years    Quit date: 10/13/2010  . Smokeless tobacco: Not on file  . Alcohol Use: Yes  . Drug Use: No  . Sexual Activity: Yes   Other Topics Concern  . Not on file   Social History Narrative     Objective: BP 116/78 mmHg  Pulse 110  Wt 134 lb (60.782  kg)  Vital signs reviewed. General: Alert and Oriented, No Acute Distress HEENT: Pupils equal, round, reactive to light. Conjunctivae clear.  External ears unremarkable.  Moist mucous membranes. Lungs: Clear and comfortable work of breathing, speaking in full sentences without accessory muscle use. Cardiac: Regular rate and rhythm.  Neuro: CN II-XII grossly intact, gait normal. Extremities: No peripheral edema.  Strong peripheral pulses.  Mental Status: No depression, anxiety, nor agitation. Logical though process. Skin: Warm and dry. Breast: Melanie Fritz present as chaperone. No supraclavicular or axillary lymphadenopathy. Left breast architecture unremarkable. Unremarkable nipple without any discharge. There is a small palpable mass at the 6:00 position at the crest of the breast about the size of a gr of rice. It is nontender and unchanged from exam last month.  Assessment & Plan: Melanie Fritz was seen today for medication refill and lump on left breast.  Diagnoses and all orders for this visit:  Left breast mass -     MM Digital Diagnostic Unilat L -     US BREAST LTD UNI LEFT INC AXILLA  Adult ADHD -     amphetamine-dextroamphetamine (ADDERALL XR) 20 MG 24 hr capsule; Take 1 capsule (20 mg total) by mouth every morning. -     amphetamine-dextroamphetamine (ADDERALL) 10 MG tablet; One by mouth every afternoon only as needed for  attention.   Left breast mass: Given persistence and patient's understandable worry about breast cancer, although low likelihood, will obtain diagnostic mammogram and breast ultrasound. I've asked her to let me know if the breast center is not able to do this before  April the next step would be to send her to [place]. ADHD: Control,refills provided given her extremely low likelihood of disuse.  Return if symptoms worsen or fail to improve.

## 2015-06-10 ENCOUNTER — Ambulatory Visit
Admission: RE | Admit: 2015-06-10 | Discharge: 2015-06-10 | Disposition: A | Discharge status: Home / Self Care | Payer: BLUE CROSS/BLUE SHIELD | Source: Ambulatory Visit | Attending: Family Medicine | Admitting: Family Medicine

## 2015-06-21 ENCOUNTER — Ambulatory Visit (INDEPENDENT_AMBULATORY_CARE_PROVIDER_SITE_OTHER): Payer: BLUE CROSS/BLUE SHIELD | Admitting: Family Medicine

## 2015-06-21 ENCOUNTER — Encounter: Payer: Self-pay | Admitting: Family Medicine

## 2015-06-21 VITALS — BP 118/82 | HR 101 | Wt 133.0 lb

## 2015-06-21 DIAGNOSIS — F419 Anxiety disorder, unspecified: Secondary | ICD-10-CM

## 2015-06-21 MED ORDER — CLONAZEPAM 0.5 MG PO TABS
0.2500 mg | ORAL_TABLET | Freq: Two times a day (BID) | ORAL | Status: DC | PRN
Start: 2015-06-21 — End: 2015-12-12

## 2015-06-21 NOTE — Progress Notes (Signed)
CC: Melanie Fritz is a 30 y.o. female is here for Anxiety   Subjective: HPI:  Patient has noticed over the last 2 or 3 weeks that she is getting panic attacks for reasons that she is unsure about. 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 She can't leave anything else is causing her stress. She is getting panic attacks described as breaking out in hives on the neck, shortness of breath and hyperventilating. Symptoms last anywhere from 30-60 minutes. They can occur in any situation. Nothing seems to trigger them. She denies any depression or difficulty sleeping. No other mental disturbance. She wants something that she can take on an as-needed basis for these symptoms. They occur 2-3 times a week. She would like to avoid taking something on a daily basis if possible. No fevers, chills or cough   Review Of Systems Outlined In HPI  Past Medical History  Diagnosis Date  . Anxiety 04/13/2012    No past surgical history on file. Family History  Problem Relation Age of Onset  . Diabetes Father   . Leukemia    . Heart attack      grandfather  . Hypertension Mother   . Stroke      grandfather    Social History   Social History  . Marital Status: Single    Spouse Name: N/A  . Number of Children: N/A  . Years of Education: N/A   Occupational History  . Not on file.   Social History Main Topics  . Smoking status: Former Smoker -- 1 years    Quit date: 10/13/2010  . Smokeless tobacco: Not on file  . Alcohol Use: Yes  . Drug Use: No  . Sexual Activity: Yes   Other Topics Concern  . Not on file   Social History Narrative     Objective: BP 118/82 mmHg  Pulse 101  Wt 133 lb (60.328 kg)  Vital signs reviewed. General: Alert and Oriented, No Acute Distress HEENT: Pupils equal, round, reactive to light. Conjunctivae clear.  External ears unremarkable.  Moist mucous  membranes. Lungs: Clear and comfortable work of breathing, speaking in full sentences without accessory muscle use. Cardiac: Regular rate and rhythm.  Neuro: CN II-XII grossly intact, gait normal. Extremities: No peripheral edema.  Strong peripheral pulses.  Mental Status: No depression, anxiety, nor agitation. Logical though process. Skin: Warm and dry.  Assessment & Plan: Lashon was seen today for anxiety.  Diagnoses and all orders for this visit:  Anxiety -     clonazePAM (KLONOPIN) 0.5 MG tablet; Take 0.5-10 tablets (0.25-5 mg total) by mouth 2 (two) times daily as needed for anxiety.   Anxiety: Uncontrolled, she's tolerated and used clonazepam responsibly in the past. I've asked her to let me know if she begins taking on a daily basis and that occur she'll need to switch to something else like Paxil or one of her former SSRIs.  Return if symptoms worsen or fail to improve.

## 2015-07-02 ENCOUNTER — Other Ambulatory Visit: Payer: Self-pay | Admitting: Family Medicine

## 2015-07-02 DIAGNOSIS — F909 Attention-deficit hyperactivity disorder, unspecified type: Secondary | ICD-10-CM

## 2015-07-03 MED ORDER — AMPHETAMINE-DEXTROAMPHET ER 20 MG PO CP24: 20.0000 mg | ORAL_CAPSULE | ORAL | Status: DC

## 2015-07-03 MED ORDER — AMPHETAMINE-DEXTROAMPHETAMINE 10 MG PO TABS: ORAL_TABLET | ORAL | Status: DC

## 2015-07-03 NOTE — Addendum Note (Signed)
Addended by: Pixie CasinoUNNINGHAM, Soua Lenk C on: 07/03/2015 11:53 AM   Modules accepted: Orders

## 2015-07-31 ENCOUNTER — Telehealth: Payer: Self-pay | Admitting: Family Medicine

## 2015-07-31 DIAGNOSIS — F909 Attention-deficit hyperactivity disorder, unspecified type: Secondary | ICD-10-CM

## 2015-07-31 MED ORDER — AMPHETAMINE-DEXTROAMPHET ER 20 MG PO CP24: 20.0000 mg | ORAL_CAPSULE | ORAL | Status: DC

## 2015-07-31 MED ORDER — AMPHETAMINE-DEXTROAMPHETAMINE 10 MG PO TABS
ORAL_TABLET | ORAL | Status: DC
Start: 2015-07-31 — End: 2015-08-29

## 2015-07-31 NOTE — Telephone Encounter (Signed)
[place], Rx placed in in-box ready for pickup/faxing.  

## 2015-07-31 NOTE — Telephone Encounter (Signed)
Please see note below. Yukio Bisping,CMA  

## 2015-07-31 NOTE — Telephone Encounter (Signed)
Pt.notified

## 2015-08-28 ENCOUNTER — Ambulatory Visit: Payer: Self-pay | Admitting: Family Medicine

## 2015-08-28 ENCOUNTER — Other Ambulatory Visit: Payer: Self-pay | Admitting: Family Medicine

## 2015-08-29 ENCOUNTER — Other Ambulatory Visit: Payer: Self-pay | Admitting: *Deleted

## 2015-08-29 DIAGNOSIS — F909 Attention-deficit hyperactivity disorder, unspecified type: Secondary | ICD-10-CM

## 2015-08-29 MED ORDER — AMPHETAMINE-DEXTROAMPHET ER 20 MG PO CP24: 20.0000 mg | ORAL_CAPSULE | ORAL | Status: DC

## 2015-08-29 MED ORDER — AMPHETAMINE-DEXTROAMPHETAMINE 10 MG PO TABS: ORAL_TABLET | ORAL | Status: DC

## 2015-08-30 ENCOUNTER — Ambulatory Visit: Payer: Self-pay | Admitting: Family Medicine

## 2015-09-25 ENCOUNTER — Other Ambulatory Visit: Payer: Self-pay | Admitting: Family Medicine

## 2015-09-25 DIAGNOSIS — F909 Attention-deficit hyperactivity disorder, unspecified type: Secondary | ICD-10-CM

## 2015-09-26 MED ORDER — AMPHETAMINE-DEXTROAMPHETAMINE 10 MG PO TABS: ORAL_TABLET | ORAL | Status: DC

## 2015-09-26 MED ORDER — AMPHETAMINE-DEXTROAMPHET ER 20 MG PO CP24: 20.0000 mg | ORAL_CAPSULE | ORAL | Status: DC

## 2015-10-22 ENCOUNTER — Encounter: Payer: Self-pay | Admitting: Family Medicine

## 2015-10-22 ENCOUNTER — Ambulatory Visit (INDEPENDENT_AMBULATORY_CARE_PROVIDER_SITE_OTHER): Payer: BLUE CROSS/BLUE SHIELD | Admitting: Family Medicine

## 2015-10-22 VITALS — BP 126/84 | HR 98 | Wt 132.0 lb

## 2015-10-22 DIAGNOSIS — F419 Anxiety disorder, unspecified: Secondary | ICD-10-CM | POA: Diagnosis not present

## 2015-10-22 DIAGNOSIS — F909 Attention-deficit hyperactivity disorder, unspecified type: Secondary | ICD-10-CM

## 2015-10-22 DIAGNOSIS — N760 Acute vaginitis: Secondary | ICD-10-CM

## 2015-10-22 DIAGNOSIS — F900 Attention-deficit hyperactivity disorder, predominantly inattentive type: Secondary | ICD-10-CM

## 2015-10-22 DIAGNOSIS — A499 Bacterial infection, unspecified: Secondary | ICD-10-CM

## 2015-10-22 DIAGNOSIS — B9689 Other specified bacterial agents as the cause of diseases classified elsewhere: Secondary | ICD-10-CM

## 2015-10-22 MED ORDER — AMPHETAMINE-DEXTROAMPHET ER 30 MG PO CP24: 30.0000 mg | ORAL_CAPSULE | Freq: Every day | ORAL | Status: DC

## 2015-10-22 MED ORDER — METRONIDAZOLE 0.75 % VA GEL: VAGINAL | Status: DC

## 2015-10-22 NOTE — Progress Notes (Signed)
CC: Melanie Fritz is a 30 y.o. female is here for Medication Management   Subjective: HPI:  ADHD: The summer is a slow time of year at her job. She is wondering if she could take just a single dose of Adderall instead of having to take 2 separate formulations. She doesn't have to devote as much attention as she does during the fall winter and spring. Last year she tried Adderall XR 25 mg however wore off sometime around 11:00. She denies any side effects from her current regimen. She denies any anxiety, sleep disturbance, nor appetite suppression. She denies any other mental disturbance.  She's noticed that after most of her menstrual cycles she gets vaginal itching and irritation with a foul odor. Metronidazole gel usually fixes this until she gets her next period. She is currently suffering from the above symptoms. She denies any other genitourinary complaints. She denies any fevers, chills or pelvic pain     Review Of Systems Outlined In HPI  Past Medical History  Diagnosis Date  . Anxiety 04/13/2012    No past surgical history on file. Family History  Problem Relation Age of Onset  . Diabetes Father   . Leukemia    . Heart attack      grandfather  . Hypertension Mother   . Stroke      grandfather    Social History   Social History  . Marital Status: Single    Spouse Name: N/A  . Number of Children: N/A  . Years of Education: N/A   Occupational History  . Not on file.   Social History Main Topics  . Smoking status: Former Smoker -- 1 years    Quit date: 10/13/2010  . Smokeless tobacco: Not on file  . Alcohol Use: Yes  . Drug Use: No  . Sexual Activity: Yes   Other Topics Concern  . Not on file   Social History Narrative     Objective: BP 126/84 mmHg  Pulse 98  Wt 132 lb (59.875 kg)  Vital signs reviewed. General: Alert and Oriented, No Acute Distress HEENT: Pupils equal, round, reactive to light. Conjunctivae clear.  External ears unremarkable.  Moist  mucous membranes. Lungs: Clear and comfortable work of breathing, speaking in full sentences without accessory muscle use. Cardiac: Regular rate and rhythm.  Neuro: CN II-XII grossly intact, gait normal. Extremities: No peripheral edema.  Strong peripheral pulses.  Mental Status: No depression, anxiety, nor agitation. Logical though process. Skin: Warm and dry. Assessment & Plan: Melanie Fritz was seen today for medication management.  Diagnoses and all orders for this visit:  Anxiety  Adult ADHD  Bacterial vaginosis -     metroNIDAZOLE (METROGEL) 0.75 % vaginal gel; One applicatorful delivered into vagina every evening for five days.  Other orders -     amphetamine-dextroamphetamine (ADDERALL XR) 30 MG 24 hr capsule; Take 1 capsule (30 mg total) by mouth daily.  ADHD: Uncontrolled chronic condition on Adderall XR 20 mg alone. Joint decision to increase to 30 mg daily and temporarily stopping her immediate release dose in the afternoon. Recurrent bacterial vaginosis: Start metronidazole again. I also gave her information on how to use boric acid suppositories to help reduce the chances of recurrent bacterial vaginosis. I emphasized that this should not be taken orally.   Return in about 3 months (around 01/22/2016) for Attention.

## 2015-11-12 ENCOUNTER — Ambulatory Visit (INDEPENDENT_AMBULATORY_CARE_PROVIDER_SITE_OTHER): Payer: BLUE CROSS/BLUE SHIELD | Admitting: Family Medicine

## 2015-11-12 ENCOUNTER — Encounter: Payer: Self-pay | Admitting: Family Medicine

## 2015-11-12 VITALS — BP 125/85 | HR 94 | Wt 134.0 lb

## 2015-11-12 DIAGNOSIS — F909 Attention-deficit hyperactivity disorder, unspecified type: Secondary | ICD-10-CM

## 2015-11-12 DIAGNOSIS — F900 Attention-deficit hyperactivity disorder, predominantly inattentive type: Secondary | ICD-10-CM | POA: Diagnosis not present

## 2015-11-12 MED ORDER — AMPHETAMINE-DEXTROAMPHETAMINE 20 MG PO TABS: 20.0000 mg | ORAL_TABLET | Freq: Two times a day (BID) | ORAL | Status: DC

## 2015-11-12 NOTE — Progress Notes (Signed)
CC: Melanie Fritz is a 30 y.o. female is here for Medication Problem   Subjective: HPI:  Follow ADHD: She was recently transitioned to Adderall XR at a slightly higher dose. She tells me that she feels like this medication has lost its effectiveness as of 2 weeks ago. She denies any other changes in her lifestyle, diet, or activity level. Her job is still the same as well. She tells me that she finds herself getting distracted and losing her track of mind at work to moderate degree. She denies any recent head trauma or any other motor or sensory disturbances. She was noted there's some other alternative dosing that she could try. Denies any anxiety, depression, appetite suppression or sleep disturbance   Review Of Systems Outlined In HPI  Past Medical History  Diagnosis Date  . Anxiety 04/13/2012    No past surgical history on file. Family History  Problem Relation Age of Onset  . Diabetes Father   . Leukemia    . Heart attack      grandfather  . Hypertension Mother   . Stroke      grandfather    Social History   Social History  . Marital Status: Single    Spouse Name: N/A  . Number of Children: N/A  . Years of Education: N/A   Occupational History  . Not on file.   Social History Main Topics  . Smoking status: Former Smoker -- 1 years    Quit date: 10/13/2010  . Smokeless tobacco: Not on file  . Alcohol Use: Yes  . Drug Use: No  . Sexual Activity: Yes   Other Topics Concern  . Not on file   Social History Narrative     Objective: BP 125/85 mmHg  Pulse 94  Wt 134 lb (60.782 kg)  Vital signs reviewed. General: Alert and Oriented, No Acute Distress HEENT: Pupils equal, round, reactive to light. Conjunctivae clear.  External ears unremarkable.  Moist mucous membranes. Lungs: Clear and comfortable work of breathing, speaking in full sentences without accessory muscle use. Cardiac: Regular rate and rhythm.  Neuro: CN II-XII grossly intact, gait  normal. Extremities: No peripheral edema.  Strong peripheral pulses.  Mental Status: No depression, anxiety, nor agitation. Logical though process. Skin: Warm and dry.  Assessment & Plan: Melanie Fritz was seen today for medication problem.  Diagnoses and all orders for this visit:  Adult ADHD  Other orders -     amphetamine-dextroamphetamine (ADDERALL) 20 MG tablet; Take 1 tablet (20 mg total) by mouth 2 (two) times daily.   Adult ADHD: I discussed switching to Vyvanse versus a different regimen of Adderall. She tells that she would like to stick with Adderall since she No she does get side effects from it. Switching to 20 mg twice a day if no benefit she is agreeable to thinking about going to Vyvanse instead.  Discussed with this patient that I will be resigning from my position here with [facility] in September in order to stay with my [place]. I let him know about the providers that are still accepting patients and I feel that this individual will be under great care if he/she stays here with [facility].  Return in about 4 weeks (around 12/10/2015) for ADHD.

## 2015-11-14 ENCOUNTER — Ambulatory Visit: Payer: Self-pay | Admitting: Family Medicine

## 2015-12-12 ENCOUNTER — Telehealth: Payer: Self-pay

## 2015-12-12 ENCOUNTER — Encounter: Payer: Self-pay | Admitting: Family Medicine

## 2015-12-12 ENCOUNTER — Ambulatory Visit (INDEPENDENT_AMBULATORY_CARE_PROVIDER_SITE_OTHER): Payer: BLUE CROSS/BLUE SHIELD | Admitting: Family Medicine

## 2015-12-12 VITALS — BP 120/86 | HR 96 | Wt 134.0 lb

## 2015-12-12 DIAGNOSIS — F900 Attention-deficit hyperactivity disorder, predominantly inattentive type: Secondary | ICD-10-CM

## 2015-12-12 DIAGNOSIS — F419 Anxiety disorder, unspecified: Secondary | ICD-10-CM

## 2015-12-12 DIAGNOSIS — F909 Attention-deficit hyperactivity disorder, unspecified type: Secondary | ICD-10-CM

## 2015-12-12 MED ORDER — LISDEXAMFETAMINE DIMESYLATE 40 MG PO CAPS: 40.0000 mg | ORAL_CAPSULE | ORAL | 0 refills | Status: DC

## 2015-12-12 MED ORDER — CLONAZEPAM 0.5 MG PO TABS: 0.2500 mg | ORAL_TABLET | Freq: Two times a day (BID) | ORAL | 1 refills | Status: DC | PRN

## 2015-12-12 NOTE — Telephone Encounter (Signed)
Corrected Rx faxed

## 2015-12-12 NOTE — Progress Notes (Signed)
CC: Melanie Fritz is a 30 y.o. female is here for ADHD; Anxiety; and Medication Refill   Subjective: HPI:  Over the past few months she has been trying different formulations of Adderall and she has not found a formulation that meets her needs at work. Currently she is taking 20 mg of immediate release Adderall twice a day and it doesn't seem to last throughout the end of her 10 hour shift. For the last few hours of her shift she finds that she is distracted easily cannot multitask and frequently gets off task. She denies any known side effects from this medication. She wants her something different she can take. Symptoms are moderate in severity at the end of the day.  Her anxiety is doing pretty well these days. She is infrequently taking clonazepam, she has not required a refill since February.   Review Of Systems Outlined In HPI  Past Medical History:  Diagnosis Date  . Anxiety 04/13/2012    No past surgical history on file. Family History  Problem Relation Age of Onset  . Diabetes Father   . Leukemia    . Heart attack      grandfather  . Hypertension Mother   . Stroke      grandfather    Social History   Social History  . Marital status: Single    Spouse name: N/A  . Number of children: N/A  . Years of education: N/A   Occupational History  . Not on file.   Social History Main Topics  . Smoking status: Former Smoker    Years: 1.00    Quit date: 10/13/2010  . Smokeless tobacco: Not on file  . Alcohol use Yes  . Drug use: No  . Sexual activity: Yes   Other Topics Concern  . Not on file   Social History Narrative  . No narrative on file     Objective: BP 120/86   Pulse 96   Wt 134 lb (60.8 kg)   BMI 23.74 kg/m   Vital signs reviewed. General: Alert and Oriented, No Acute Distress HEENT: Pupils equal, round, reactive to light. Conjunctivae clear.  External ears unremarkable.  Moist mucous membranes. Lungs: Clear and comfortable work of breathing,  speaking in full sentences without accessory muscle use. Cardiac: Regular rate and rhythm.  Neuro: CN II-XII grossly intact, gait normal. Extremities: No peripheral edema.  Strong peripheral pulses.  Mental Status: No depression, anxiety, nor agitation. Logical though process. Skin: Warm and dry.  Assessment & Plan: Melanie Fritz was seen today for adhd, anxiety and medication refill.  Diagnoses and all orders for this visit:  Adult ADHD  Anxiety -     clonazePAM (KLONOPIN) 0.5 MG tablet; Take 0.5-10 tablets (0.25-5 mg total) by mouth 2 (two) times daily as needed for anxiety.  Other orders -     Cancel: amphetamine-dextroamphetamine (ADDERALL) 20 MG tablet; Take 1 tablet (20 mg total) by mouth 2 (two) times daily. -     lisdexamfetamine (VYVANSE) 40 MG capsule; Take 1 capsule (40 mg total) by mouth every morning.   Adult ADHD: Uncontrolled with Adderall, switching to Vyvanse, I've asked her to call me by the end of the month so I can either adjust the dosage if needed or provide her with 2 additional months with my upcoming departure. Anxiety: Currently controlled with infrequent use of clonazepam, refills provided with my upcoming departure.  Return in about 3 months (around 03/13/2016).

## 2015-12-12 NOTE — Telephone Encounter (Signed)
Sorry, corrected Rx in your in box (0.25-0.5mg )

## 2015-12-17 ENCOUNTER — Encounter: Payer: Self-pay | Admitting: Family Medicine

## 2015-12-18 MED ORDER — LISDEXAMFETAMINE DIMESYLATE 60 MG PO CAPS: 60.0000 mg | ORAL_CAPSULE | ORAL | 0 refills | Status: DC

## 2015-12-23 ENCOUNTER — Ambulatory Visit: Payer: Self-pay | Admitting: Family Medicine

## 2015-12-24 ENCOUNTER — Encounter: Payer: Self-pay | Admitting: Family Medicine

## 2015-12-25 MED ORDER — AMPHETAMINE-DEXTROAMPHETAMINE 30 MG PO TABS: 30.0000 mg | ORAL_TABLET | Freq: Two times a day (BID) | ORAL | 0 refills | Status: DC

## 2015-12-26 ENCOUNTER — Ambulatory Visit: Payer: Self-pay | Admitting: Family Medicine

## 2015-12-27 ENCOUNTER — Ambulatory Visit: Payer: Self-pay | Admitting: Family Medicine

## 2016-01-02 ENCOUNTER — Encounter: Payer: Self-pay | Admitting: Family Medicine

## 2016-01-06 ENCOUNTER — Other Ambulatory Visit: Payer: Self-pay | Admitting: Physician Assistant

## 2016-01-06 MED ORDER — AMPHETAMINE-DEXTROAMPHETAMINE 30 MG PO TABS: 30.0000 mg | ORAL_TABLET | Freq: Two times a day (BID) | ORAL | 0 refills | Status: DC

## 2016-03-16 ENCOUNTER — Other Ambulatory Visit: Payer: Self-pay | Admitting: Physician Assistant

## 2016-03-16 ENCOUNTER — Telehealth: Payer: Self-pay

## 2016-03-16 ENCOUNTER — Ambulatory Visit: Payer: Self-pay | Admitting: Physician Assistant

## 2016-03-16 MED ORDER — AMPHETAMINE-DEXTROAMPHETAMINE 30 MG PO TABS: 30.0000 mg | ORAL_TABLET | Freq: Two times a day (BID) | ORAL | 0 refills | Status: DC

## 2016-03-16 NOTE — Telephone Encounter (Signed)
I printed off adderall. Needs follow up next month.

## 2016-03-16 NOTE — Telephone Encounter (Signed)
Pt is requesting a refill of adderall. Is this appropriate?

## 2016-03-17 NOTE — Telephone Encounter (Signed)
PT NOTIFIED  

## 2016-04-15 ENCOUNTER — Other Ambulatory Visit: Payer: Self-pay | Admitting: Physician Assistant

## 2016-04-17 MED ORDER — AMPHETAMINE-DEXTROAMPHETAMINE 30 MG PO TABS: 30.0000 mg | ORAL_TABLET | Freq: Two times a day (BID) | ORAL | 0 refills | Status: DC

## 2016-05-04 ENCOUNTER — Encounter: Payer: Self-pay | Admitting: Physician Assistant

## 2016-05-04 ENCOUNTER — Ambulatory Visit (INDEPENDENT_AMBULATORY_CARE_PROVIDER_SITE_OTHER): Payer: BLUE CROSS/BLUE SHIELD | Admitting: Physician Assistant

## 2016-05-04 VITALS — BP 118/79 | HR 111 | Ht 63.0 in | Wt 129.0 lb

## 2016-05-04 DIAGNOSIS — B9689 Other specified bacterial agents as the cause of diseases classified elsewhere: Secondary | ICD-10-CM | POA: Diagnosis not present

## 2016-05-04 DIAGNOSIS — N760 Acute vaginitis: Secondary | ICD-10-CM

## 2016-05-04 DIAGNOSIS — F909 Attention-deficit hyperactivity disorder, unspecified type: Secondary | ICD-10-CM | POA: Diagnosis not present

## 2016-05-04 MED ORDER — AMPHETAMINE-DEXTROAMPHETAMINE 30 MG PO TABS: 30.0000 mg | ORAL_TABLET | Freq: Two times a day (BID) | ORAL | 0 refills | Status: DC

## 2016-05-04 NOTE — Progress Notes (Signed)
   Subjective:    Patient ID: Melanie SimmerAmanda Fritz, female    DOB: 12/27/1985, 31 y.o.   MRN: [id number]  HPI Pt is a 31 yo female who presents to the clinic to establish care after PCP left clinic.   She comes in for refill of Adderall. She has no side effects and tolerating medication very well. She would like to try to take once a day since her hours have been reduced to 8 hours instead of 12 hours. She works in [employer].   She also has recurrent BV. She has been treated multiple times with metronidazole and symptoms resolve but come back after period. She wonders how to prevent.    Review of Systems  All other systems reviewed and are negative.      Objective:   Physical Exam  Constitutional: She is oriented to person, place, and time. She appears well-developed and well-nourished.  HENT:  Head: Normocephalic and atraumatic.  Cardiovascular: Normal rate, regular rhythm and normal heart sounds.   Pulmonary/Chest: Effort normal and breath sounds normal.  Neurological: She is alert and oriented to person, place, and time.  Skin: Skin is dry.  Psychiatric: She has a normal mood and affect. Her behavior is normal.          Assessment & Plan:  Marland Kitchen.Marland Kitchen.Diagnoses and all orders for this visit:  Adult ADHD -     amphetamine-dextroamphetamine (ADDERALL) 30 MG tablet; Take 1 tablet by mouth 2 (two) times daily. Do not refill until 06/26/16.  History of Reoccuring BV  Other orders -     Discontinue: amphetamine-dextroamphetamine (ADDERALL) 30 MG tablet; Take 1 tablet by mouth 2 (two) times daily. -     Discontinue: amphetamine-dextroamphetamine (ADDERALL) 30 MG tablet; Take 1 tablet by mouth 2 (two) times daily. Do not refill until 06/01/16.  ok to start adderall one tablet daily. If symptoms are controlled.  Follow up in 3 months.   Discussed boric acid suppositories. Advised NOT to eat and not meant for oral use. Discussed testing vaginal PH and keeping below 4.5. Follow up as  needed.

## 2016-06-20 ENCOUNTER — Encounter: Payer: Self-pay | Admitting: Emergency Medicine

## 2016-06-20 ENCOUNTER — Emergency Department
Admission: EM | Admit: 2016-06-20 | Discharge: 2016-06-20 | Disposition: A | Discharge status: Home / Self Care | Payer: BLUE CROSS/BLUE SHIELD | Source: Home / Self Care | Attending: Family Medicine | Admitting: Family Medicine

## 2016-06-20 DIAGNOSIS — F419 Anxiety disorder, unspecified: Secondary | ICD-10-CM | POA: Diagnosis not present

## 2016-06-20 DIAGNOSIS — R002 Palpitations: Secondary | ICD-10-CM

## 2016-06-20 DIAGNOSIS — R5383 Other fatigue: Secondary | ICD-10-CM

## 2016-06-20 DIAGNOSIS — R0602 Shortness of breath: Secondary | ICD-10-CM

## 2016-06-20 LAB — POCT CBC W AUTO DIFF (K'VILLE URGENT CARE)

## 2016-06-20 NOTE — ED Provider Notes (Signed)
CSN: [id number]     Arrival date & time 06/20/16  1229 History   First MD Initiated Contact with Patient 06/20/16 1302     Chief Complaint  Patient presents with  . Anxiety   (Consider location/radiation/quality/duration/timing/severity/associated sxs/prior Treatment) HPI Melanie Fritz is a 31 y.o. female presenting to UC with c/o 5 days of intermittent SOB, fatigue, palpitations, and body aches. Minimal dry cough.  She reports increased stress at work and hx of anxiety.  She was on Klonopin about 6 months ago but no longer felt she needed the medication.  Pt's mother recommended she come be evaluated for the palpitations and have her thyroid checked due to family hx of thyroid disease. Pt is not currently on anti-anxiety medications. Denies recent URI symptoms of congestion, fever, chills.    Past Medical History:  Diagnosis Date  . Anxiety 04/13/2012   History reviewed. No pertinent surgical history. Family History  Problem Relation Age of Onset  . Diabetes Father   . Leukemia    . Heart attack      grandfather  . Hypertension Mother   . Stroke      grandfather   Social History  Substance Use Topics  . Smoking status: Former Smoker    Years: 1.00    Quit date: 10/13/2010  . Smokeless tobacco: Never Used  . Alcohol use Yes   OB History    No data available     Review of Systems  Constitutional: Positive for fatigue. Negative for chills and fever.  HENT: Negative for congestion, ear pain, sore throat, trouble swallowing and voice change.   Respiratory: Positive for cough (minimal dry) and chest tightness. Negative for shortness of breath.   Cardiovascular: Positive for palpitations. Negative for chest pain.  Gastrointestinal: Negative for abdominal pain, diarrhea, nausea and vomiting.  Musculoskeletal: Positive for arthralgias and myalgias. Negative for back pain.       Body aches  Skin: Negative for rash.    Allergies  Effexor xr [venlafaxine hcl er] and Wellbutrin  [bupropion]  Home Medications   Prior to Admission medications   Medication Sig Start Date End Date Taking? Authorizing Provider  amphetamine-dextroamphetamine (ADDERALL) 30 MG tablet Take 1 tablet by mouth 2 (two) times daily. Do not refill until 06/26/16. 05/04/16   Jomarie LongsJade L Breeback, PA-C  clonazePAM (KLONOPIN) 0.5 MG tablet Take 0.5-1 tablets (0.25-0.5 mg total) by mouth 2 (two) times daily as needed for anxiety. 12/12/15   Sean Hommel, DO  metroNIDAZOLE (METROGEL) 0.75 % vaginal gel One applicatorful delivered into vagina every evening for five days. 10/22/15 10/21/16  Laren BoomSean Hommel, DO   Meds Ordered and Administered this Visit  Medications - No data to display  BP 124/86 (BP Location: Right Arm)   Pulse 90   Temp 97.8 F (36.6 C) (Oral)   Wt 129 lb (58.5 kg)   SpO2 100%   BMI 22.85 kg/m  No data found.   Physical Exam  Constitutional: She is oriented to person, place, and time. She appears well-developed and well-nourished. No distress.  HENT:  Head: Normocephalic and atraumatic.  Right Ear: Tympanic membrane normal.  Left Ear: Tympanic membrane normal.  Nose: Nose normal.  Mouth/Throat: Uvula is midline, oropharynx is clear and moist and mucous membranes are normal.  Eyes: EOM are normal.  Neck: Normal range of motion. Neck supple.  Cardiovascular: Normal rate and regular rhythm.   Pulmonary/Chest: Effort normal and breath sounds normal. No stridor. No respiratory distress. She has no wheezes. She has  no rales.  Musculoskeletal: Normal range of motion.  Lymphadenopathy:    She has no cervical adenopathy.  Neurological: She is alert and oriented to person, place, and time.  Skin: Skin is warm and dry. She is not diaphoretic.  Psychiatric: She has a normal mood and affect. Her behavior is normal.  Nursing note and vitals reviewed.   Urgent Care Course     Procedures (including critical care time)  Labs Review Labs Reviewed  BASIC METABOLIC PANEL  TSH  POCT CBC W AUTO  DIFF ([facility])    Imaging Review No results found.    MDM   1. Anxiety   2. Palpitations   3. Shortness of breath   4. Fatigue, unspecified type    Pt c/o intermittent SOB, fatigue, and hx of anxiety.  Vitals: WNL with O2 Sat being 100% on RA No respiratory distress noted on exam.  CBC: WNL, no evidence of anemia BMP and TSH- pending.  Symptoms could be due to anxiety vs underlying thyroid disease. Encouraged f/u with PCP next week Patient verbalized understanding and agreement with treatment plan.    Junius Finner, PA-C 06/20/16 1402

## 2016-06-20 NOTE — ED Triage Notes (Signed)
Pt c/o SOB and fatigue x5 days. States she has hx of anxiety.

## 2016-06-21 ENCOUNTER — Telehealth: Payer: Self-pay | Admitting: Emergency Medicine

## 2016-06-21 LAB — BASIC METABOLIC PANEL
BUN: 8 mg/dL (ref 7–25)
CO2: 28 mmol/L (ref 20–31)
Calcium: 9.9 mg/dL (ref 8.6–10.2)
Chloride: 102 mmol/L (ref 98–110)
Creat: 0.74 mg/dL (ref 0.50–1.10)
Glucose, Bld: 89 mg/dL (ref 65–99)
Potassium: 5 mmol/L (ref 3.5–5.3)
Sodium: 138 mmol/L (ref 135–146)

## 2016-06-21 LAB — TSH: TSH: 1.44 mIU/L

## 2016-06-21 NOTE — Telephone Encounter (Signed)
Attempted to contact patient no answer , left a message to [facility] to obtain results of lab completed on 06/20/2016.

## 2016-06-22 ENCOUNTER — Telehealth: Payer: Self-pay | Admitting: *Deleted

## 2016-06-22 NOTE — Telephone Encounter (Signed)
Spoke to pt given lab results.  

## 2016-06-25 ENCOUNTER — Encounter: Payer: Self-pay | Admitting: Physician Assistant

## 2016-06-26 ENCOUNTER — Other Ambulatory Visit: Payer: Self-pay | Admitting: Physician Assistant

## 2016-06-26 DIAGNOSIS — F419 Anxiety disorder, unspecified: Secondary | ICD-10-CM

## 2016-06-26 MED ORDER — CLONAZEPAM 0.5 MG PO TABS: 0.2500 mg | ORAL_TABLET | Freq: Two times a day (BID) | ORAL | 0 refills | Status: DC | PRN

## 2016-08-05 ENCOUNTER — Ambulatory Visit (INDEPENDENT_AMBULATORY_CARE_PROVIDER_SITE_OTHER): Payer: BLUE CROSS/BLUE SHIELD | Admitting: Physician Assistant

## 2016-08-05 ENCOUNTER — Encounter: Payer: Self-pay | Admitting: Physician Assistant

## 2016-08-05 VITALS — BP 122/80 | HR 98 | Ht 63.0 in | Wt 133.0 lb

## 2016-08-05 DIAGNOSIS — Z114 Encounter for screening for human immunodeficiency virus [HIV]: Secondary | ICD-10-CM | POA: Diagnosis not present

## 2016-08-05 DIAGNOSIS — Z23 Encounter for immunization: Secondary | ICD-10-CM

## 2016-08-05 DIAGNOSIS — Z13220 Encounter for screening for lipoid disorders: Secondary | ICD-10-CM | POA: Diagnosis not present

## 2016-08-05 DIAGNOSIS — Z131 Encounter for screening for diabetes mellitus: Secondary | ICD-10-CM | POA: Diagnosis not present

## 2016-08-05 DIAGNOSIS — F909 Attention-deficit hyperactivity disorder, unspecified type: Secondary | ICD-10-CM

## 2016-08-05 LAB — COMPLETE METABOLIC PANEL WITH GFR
ALBUMIN: 4.3 g/dL (ref 3.6–5.1)
ALK PHOS: 49 U/L (ref 33–115)
ALT: 17 U/L (ref 6–29)
AST: 20 U/L (ref 10–30)
BILIRUBIN TOTAL: 0.4 mg/dL (ref 0.2–1.2)
BUN: 10 mg/dL (ref 7–25)
CO2: 25 mmol/L (ref 20–31)
Calcium: 9.7 mg/dL (ref 8.6–10.2)
Chloride: 105 mmol/L (ref 98–110)
Creat: 0.74 mg/dL (ref 0.50–1.10)
GFR, Est African American: >89 mL/min (ref >=60)
GFR, Est Non African American: >89 mL/min (ref >=60)
GLUCOSE: 79 mg/dL (ref 65–99)
POTASSIUM: 4.2 mmol/L (ref 3.5–5.3)
SODIUM: 141 mmol/L (ref 135–146)
TOTAL PROTEIN: 6.7 g/dL (ref 6.1–8.1)

## 2016-08-05 LAB — LIPID PANEL
Cholesterol: 167 mg/dL (ref <200)
HDL: 72 mg/dL (ref >50)
LDL Cholesterol: 83 mg/dL (ref <100)
Total CHOL/HDL Ratio: 2.3 Ratio (ref <5.0)
Triglycerides: 61 mg/dL (ref <150)
VLDL: 12 mg/dL (ref <30)

## 2016-08-05 MED ORDER — AMPHETAMINE-DEXTROAMPHETAMINE 30 MG PO TABS: 30.0000 mg | ORAL_TABLET | Freq: Two times a day (BID) | ORAL | 0 refills | Status: DC

## 2016-08-05 NOTE — Progress Notes (Signed)
   Subjective:    Patient ID: Melanie Fritz, female    DOB: 08-27-1985, 31 y.o.   MRN: [id number]  HPI Pt is a 31 yo female who presents to the clinic for 3 month ADHD follow up. She is doing well on adderall  IR in am and occasionally she will take 2nd tablet. She denies any insomnia, palpitations, agitation. She works 14 hours days.    Review of Systems  All other systems reviewed and are negative.      Objective:   Physical Exam  Constitutional: She appears well-developed and well-nourished.  HENT:  Head: Normocephalic and atraumatic.  Cardiovascular: Normal rate, regular rhythm and normal heart sounds.   Pulmonary/Chest: Effort normal and breath sounds normal.  Neurological: She is alert.  Psychiatric: She has a normal mood and affect. Her behavior is normal.          Assessment & Plan:  Marland KitchenMarland KitchenMarianna was seen today for adhd.  Diagnoses and all orders for this visit:  Encounter for screening for HIV -     HIV antibody (with reflex)  Adult ADHD -     Discontinue: amphetamine-dextroamphetamine (ADDERALL) 30 MG tablet; Take 1 tablet by mouth 2 (two) times daily. -     Discontinue: amphetamine-dextroamphetamine (ADDERALL) 30 MG tablet; Take 1 tablet by mouth 2 (two) times daily. Do not refill until 09/04/16. -     amphetamine-dextroamphetamine (ADDERALL) 30 MG tablet; Take 1 tablet by mouth 2 (two) times daily. Do not refill until 10/05/16.  Screening for lipid disorders -     Lipid panel  Screening for diabetes mellitus -     COMPLETE METABOLIC PANEL WITH GFR  Need for Tdap vaccination -     Tdap vaccine greater than or equal to 7yo IM   Follow up in 3 months.

## 2016-08-06 LAB — HIV ANTIBODY (ROUTINE TESTING W REFLEX): HIV: NONREACTIVE

## 2016-08-13 DIAGNOSIS — R079 Chest pain, unspecified: Secondary | ICD-10-CM | POA: Diagnosis not present

## 2016-08-13 DIAGNOSIS — R000 Tachycardia, unspecified: Secondary | ICD-10-CM | POA: Diagnosis not present

## 2016-08-13 DIAGNOSIS — R05 Cough: Secondary | ICD-10-CM | POA: Diagnosis not present

## 2016-08-13 DIAGNOSIS — R0602 Shortness of breath: Secondary | ICD-10-CM | POA: Diagnosis not present

## 2016-08-13 DIAGNOSIS — Z87891 Personal history of nicotine dependence: Secondary | ICD-10-CM | POA: Diagnosis not present

## 2016-08-13 DIAGNOSIS — Z79899 Other long term (current) drug therapy: Secondary | ICD-10-CM | POA: Diagnosis not present

## 2016-11-02 ENCOUNTER — Other Ambulatory Visit: Payer: Self-pay | Admitting: Physician Assistant

## 2016-11-02 DIAGNOSIS — F909 Attention-deficit hyperactivity disorder, unspecified type: Secondary | ICD-10-CM

## 2016-11-03 MED ORDER — AMPHETAMINE-DEXTROAMPHETAMINE 30 MG PO TABS: 30.0000 mg | ORAL_TABLET | Freq: Two times a day (BID) | ORAL | 0 refills | Status: DC

## 2016-12-08 ENCOUNTER — Encounter: Payer: Self-pay | Admitting: Physician Assistant

## 2016-12-08 ENCOUNTER — Ambulatory Visit (INDEPENDENT_AMBULATORY_CARE_PROVIDER_SITE_OTHER): Payer: Self-pay | Admitting: Physician Assistant

## 2016-12-08 DIAGNOSIS — F909 Attention-deficit hyperactivity disorder, unspecified type: Secondary | ICD-10-CM

## 2016-12-08 MED ORDER — AMPHETAMINE-DEXTROAMPHETAMINE 30 MG PO TABS: 30.0000 mg | ORAL_TABLET | Freq: Two times a day (BID) | ORAL | 0 refills | Status: DC

## 2016-12-08 MED ORDER — METRONIDAZOLE 500 MG PO TABS: 500.0000 mg | ORAL_TABLET | Freq: Two times a day (BID) | ORAL | 0 refills | Status: DC

## 2016-12-08 NOTE — Progress Notes (Signed)
   Subjective:    Patient ID: Melanie Fritz, female    DOB: 06/15/1985, 31 y.o.   MRN: [name]  HPI  Pt is a 31 yo female who presents to the clinic for 3 month ADHD follow up. She is doing great on adderall. She is doing wonderful at work. She denies any palpitations, anxiety, insomnia.   Pt has hx of BV after every period. She just finished cycle and has fishy odor. No itching. She lost boric acid directions. No abdominal pain.   .. Active Ambulatory Problems    Diagnosis Date Noted  . Anxiety 04/13/2012  . History of Reoccuring BV 05/03/2012  . Bacterial vaginosis 05/18/2012  . Depression 06/06/2012  . Sprain of ankle, right 09/05/2012  . Adult ADHD 07/13/2014  . Left breast mass 05/09/2015   Resolved Ambulatory Problems    Diagnosis Date Noted  . No Resolved Ambulatory Problems   Past Medical History:  Diagnosis Date  . Anxiety 04/13/2012      Review of Systems  All other systems reviewed and are negative.      Objective:   Physical Exam  Constitutional: She is oriented to person, place, and time. She appears well-developed and well-nourished.  HENT:  Head: Normocephalic and atraumatic.  Cardiovascular: Normal rate, regular rhythm and normal heart sounds.   Pulmonary/Chest: Effort normal and breath sounds normal.  Neurological: She is alert and oriented to person, place, and time.  Psychiatric: She has a normal mood and affect. Her behavior is normal.          Assessment & Plan:  Melanie Fritz.Melanie Fritz.Melanie Fritz was seen today for follow-up.  Diagnoses and all orders for this visit:  Adult ADHD -     Discontinue: amphetamine-dextroamphetamine (ADDERALL) 30 MG tablet; Take 1 tablet by mouth 2 (two) times daily. -     Discontinue: amphetamine-dextroamphetamine (ADDERALL) 30 MG tablet; Take 1 tablet by mouth 2 (two) times daily. Do not refill until 01/06/17 -     amphetamine-dextroamphetamine (ADDERALL) 30 MG tablet; Take 1 tablet by mouth 2 (two) times daily. Do not refill until  02/04/17  Other orders -     metroNIDAZOLE (FLAGYL) 500 MG tablet; Take 1 tablet (500 mg total) by mouth 2 (two) times daily. For 7 days.   Adderall refilled for 3 months.   Treated emprically for BV based on symptoms with metronidazole. Discussed boric acid for prevention. Follow up if not improving.

## 2017-02-23 ENCOUNTER — Encounter: Payer: Self-pay | Admitting: Physician Assistant

## 2017-02-23 ENCOUNTER — Ambulatory Visit (INDEPENDENT_AMBULATORY_CARE_PROVIDER_SITE_OTHER): Payer: BLUE CROSS/BLUE SHIELD | Admitting: Physician Assistant

## 2017-02-23 VITALS — BP 119/83 | HR 91 | Ht 63.0 in | Wt 123.0 lb

## 2017-02-23 DIAGNOSIS — F419 Anxiety disorder, unspecified: Secondary | ICD-10-CM | POA: Diagnosis not present

## 2017-02-23 DIAGNOSIS — F341 Dysthymic disorder: Secondary | ICD-10-CM

## 2017-02-23 DIAGNOSIS — M62838 Other muscle spasm: Secondary | ICD-10-CM

## 2017-02-23 DIAGNOSIS — B9689 Other specified bacterial agents as the cause of diseases classified elsewhere: Secondary | ICD-10-CM

## 2017-02-23 DIAGNOSIS — F909 Attention-deficit hyperactivity disorder, unspecified type: Secondary | ICD-10-CM | POA: Diagnosis not present

## 2017-02-23 DIAGNOSIS — N760 Acute vaginitis: Secondary | ICD-10-CM

## 2017-02-23 DIAGNOSIS — N898 Other specified noninflammatory disorders of vagina: Secondary | ICD-10-CM | POA: Diagnosis not present

## 2017-02-23 MED ORDER — METRONIDAZOLE 0.75 % VA GEL: 1.0000 | Freq: Two times a day (BID) | VAGINAL | 0 refills | Status: DC

## 2017-02-23 MED ORDER — AMPHETAMINE-DEXTROAMPHETAMINE 20 MG PO TABS: 20.0000 mg | ORAL_TABLET | Freq: Two times a day (BID) | ORAL | 0 refills | Status: DC

## 2017-02-23 MED ORDER — ESCITALOPRAM OXALATE 5 MG PO TABS: 5.0000 mg | ORAL_TABLET | Freq: Every day | ORAL | 2 refills | Status: DC

## 2017-02-23 NOTE — Progress Notes (Signed)
Subjective:    Patient ID: Melanie Fritz, female    DOB: 08/30/1985, 31 y.o.   MRN: [id number]  HPI  Patient is a 31 year old female who presents to the clinic for AD HD follow-up.  She feels like she is doing fairly well on medication.  She denies any palpitations.  She does feel like her anxiety is increasing.  She does not know if it is due to the medication.  She feels like she is always had underlying anxiety in situations lightly have made it worse.  She has an area in her upper back has a knot in it and this concerns her.  She feels like she is not able to go to sleep because she cannot shut her mind off.  She often feels kind of short of breath and winded.  She denies any cough, chest tightness.  She was seen for an ED visit in April of this year for similar symptoms and complete workup was negative for cardiac or pulmonary involvement.  Patient has a history of recurrent bacterial vaginosis.  She never used the tablets that were given to her 3 months ago because she does not like the oral form of metronidazole.  She requests metronidazole gel today for her vaginal discharge with odor.  .. Active Ambulatory Problems    Diagnosis Date Noted  . Anxiety 04/13/2012  . History of Reoccuring BV 05/03/2012  . Bacterial vaginosis 05/18/2012  . Depression 06/06/2012  . Sprain of ankle, right 09/05/2012  . Adult ADHD 07/13/2014  . Left breast mass 05/09/2015  . Muscle spasm 02/23/2017   Resolved Ambulatory Problems    Diagnosis Date Noted  . No Resolved Ambulatory Problems   Past Medical History:  Diagnosis Date  . Anxiety 04/13/2012     Review of Systems See HPI.     Objective:   Physical Exam  Constitutional: She is oriented to person, place, and time. She appears well-developed and well-nourished.  HENT:  Head: Normocephalic and atraumatic.  Neck: Normal range of motion. Neck supple.  Cardiovascular: Normal rate, regular rhythm and normal heart sounds.   Pulmonary/Chest:  Effort normal and breath sounds normal. She has no wheezes.  Musculoskeletal:  Tight Paraspinous muscles of upper back. Palpable knots over right upper back. Definite trigger point pain over palpable knot.   Neurological: She is alert and oriented to person, place, and time.  Psychiatric: She has a normal mood and affect. Her behavior is normal.          Assessment & Plan:  Marland Kitchen.Marland Kitchen.Melanie Fritz was seen today for adhd.  Diagnoses and all orders for this visit:  Adult ADHD -     amphetamine-dextroamphetamine (ADDERALL) 20 MG tablet; Take 1 tablet (20 mg total) by mouth 2 (two) times daily.  Muscle spasm  History of Reoccuring BV -     metroNIDAZOLE (METROGEL VAGINAL) 0.75 % vaginal gel; Place 1 Applicatorful vaginally 2 (two) times daily. For 5 days.  Anxiety -     escitalopram (LEXAPRO) 5 MG tablet; Take 1 tablet (5 mg total) by mouth daily.  Dysthymia -     escitalopram (LEXAPRO) 5 MG tablet; Take 1 tablet (5 mg total) by mouth daily.  Vaginal discharge -     metroNIDAZOLE (METROGEL VAGINAL) 0.75 % vaginal gel; Place 1 Applicatorful vaginally 2 (two) times daily. For 5 days.  Other orders -     Discontinue: amphetamine-dextroamphetamine (ADDERALL) 20 MG tablet; Take 1 tablet (20 mg total) by mouth 2 (two) times daily. -  Discontinue: amphetamine-dextroamphetamine (ADDERALL) 20 MG tablet; Take 1 tablet (20 mg total) by mouth 2 (two) times daily.  .. Depression screen [facility] 2/9 02/23/2017 08/05/2016  Decreased Interest 1 0  Down, Depressed, Hopeless 1 0  PHQ - 2 Score 2 0  Altered sleeping 1 -  Tired, decreased energy 1 -  Change in appetite 1 -  Feeling bad or failure about yourself  1 -  Trouble concentrating 0 -  Moving slowly or fidgety/restless 0 -  Suicidal thoughts 0 -  PHQ-9 Score 6 -   .Marland Kitchen GAD 7 : Generalized Anxiety Score 02/23/2017  Nervous, Anxious, on Edge 2  Control/stop worrying 2  Worry too much - different things 2  Trouble relaxing 2  Restless 1  Easily  annoyed or irritable 0  Afraid - awful might happen 0  Total GAD 7 Score 9      I did go ahead and refill her Adderall for 3 months.  I did decrease the dose to 20 mg twice a day.  I think this could be contributing some to her increase in anxiety.  I would also like to start her on Lexapro 5 mg daily.  She has always had a tendency to be anxious.  She even had a ED visit in April of this year for chest pain contributed to anxiety.  Discussed relaxation techniques.  Follow-up in 2 months for recheck. I did a longer follow up due to her busy holiday schedule at work and inability to come in.   I certainly think that her upper back muscle spasms could be due to stress.  Discussed massage.  Encouraged heat and Biofreeze.  Follow-up as needed.  Metronidazole gel was sent to pharmacy for bacterial vaginosis since patient could not tolerate oral therapy.  Marland KitchenSpent 30 minutes with patient and greater than 50 percent of visit spent counseling patient regarding treatment plan.

## 2017-03-23 ENCOUNTER — Telehealth: Payer: Self-pay | Admitting: Physician Assistant

## 2017-03-23 NOTE — Telephone Encounter (Signed)
Ok bring in other 2 prescriptions to void them and then we can make change.

## 2017-03-23 NOTE — Telephone Encounter (Signed)
Pt called and stated that you had switched her to 20mg  adderall 2x daily and she said it is not working and would like to go back to the 30mg  2x daily. Thanks

## 2017-03-24 NOTE — Telephone Encounter (Signed)
Called pt and made appt with Melanie Fritz for 11/29. Patient was advised to bring other prescriptions with her to appointment. Thanks

## 2017-03-25 ENCOUNTER — Ambulatory Visit (INDEPENDENT_AMBULATORY_CARE_PROVIDER_SITE_OTHER): Payer: BLUE CROSS/BLUE SHIELD | Admitting: Physician Assistant

## 2017-03-25 ENCOUNTER — Encounter: Payer: Self-pay | Admitting: Physician Assistant

## 2017-03-25 VITALS — BP 119/77 | HR 85 | Ht 63.0 in | Wt 128.0 lb

## 2017-03-25 DIAGNOSIS — F419 Anxiety disorder, unspecified: Secondary | ICD-10-CM

## 2017-03-25 DIAGNOSIS — F909 Attention-deficit hyperactivity disorder, unspecified type: Secondary | ICD-10-CM

## 2017-03-25 MED ORDER — AMPHETAMINE-DEXTROAMPHETAMINE 30 MG PO TABS: 30.0000 mg | ORAL_TABLET | Freq: Two times a day (BID) | ORAL | 0 refills | Status: DC

## 2017-03-25 NOTE — Progress Notes (Signed)
   Subjective:    Patient ID: Melanie Fritz, female    DOB: 02/11/1986, 31 y.o.   MRN: [id number]  HPI Pt is a 31 yo female with ADHD and anxiety who presents to the clinic to discussed adderall dosage.   At last visit we decreased her Adderall dose to 20 mg twice a day thinking it could be contributing to anxiety.  She feels like this dose has not been effective.  She would like to go back to her 30 mg twice a day dose.  She does feel like her anxiety has improved substantially with Lexapro 5 mg.  She is very happy with this dose.  She denies any side effects.  Overall she feels like her anxiety is very well controlled.  Patient does bring in her to 20 mg twice a day prescriptions to be voided so that we can give her her new prescription.  .. Active Ambulatory Problems    Diagnosis Date Noted  . Anxiety 04/13/2012  . History of Reoccuring BV 05/03/2012  . Bacterial vaginosis 05/18/2012  . Depression 06/06/2012  . Sprain of ankle, right 09/05/2012  . Adult ADHD 07/13/2014  . Left breast mass 05/09/2015  . Muscle spasm 02/23/2017   Resolved Ambulatory Problems    Diagnosis Date Noted  . No Resolved Ambulatory Problems   Past Medical History:  Diagnosis Date  . Anxiety 04/13/2012      Review of Systems  All other systems reviewed and are negative.      Objective:   Physical Exam  Constitutional: She is oriented to person, place, and time. She appears well-developed and well-nourished.  HENT:  Head: Normocephalic and atraumatic.  Cardiovascular: Normal rate.  Neurological: She is alert and oriented to person, place, and time.  Psychiatric: She has a normal mood and affect. Her behavior is normal.          Assessment & Plan:  Melanie Fritz.Melanie Fritz.Melanie Fritz was seen today for adhd.  Diagnoses and all orders for this visit:  Adult ADHD -     amphetamine-dextroamphetamine (ADDERALL) 30 MG tablet; Take 1 tablet by mouth 2 (two) times daily. -     amphetamine-dextroamphetamine (ADDERALL) 30  MG tablet; Take 1 tablet by mouth 2 (two) times daily. Do not refill until 04/23/17 -     amphetamine-dextroamphetamine (ADDERALL) 30 MG tablet; Take 1 tablet by mouth 2 (two) times daily. Do not refill until 05/23/17  Anxiety   I did go ahead and increase her Adderall dose to 30 mg twice a day and gave her 2621-month supply.  Her anxiety has improved a lot with Lexapro.  Continue Lexapro.  Patient does bring in her 2 prescriptions that we voided.  [place] controlled substance registry reviewed with no concerns.

## 2017-04-13 ENCOUNTER — Other Ambulatory Visit: Payer: Self-pay | Admitting: *Deleted

## 2017-04-13 DIAGNOSIS — F419 Anxiety disorder, unspecified: Secondary | ICD-10-CM

## 2017-04-13 DIAGNOSIS — F341 Dysthymic disorder: Secondary | ICD-10-CM

## 2017-04-13 MED ORDER — ESCITALOPRAM OXALATE 5 MG PO TABS: 5.0000 mg | ORAL_TABLET | Freq: Every day | ORAL | 0 refills | Status: DC

## 2017-04-23 ENCOUNTER — Other Ambulatory Visit: Payer: Self-pay | Admitting: Physician Assistant

## 2017-04-23 DIAGNOSIS — B9689 Other specified bacterial agents as the cause of diseases classified elsewhere: Secondary | ICD-10-CM

## 2017-04-23 DIAGNOSIS — N898 Other specified noninflammatory disorders of vagina: Secondary | ICD-10-CM

## 2017-04-23 DIAGNOSIS — N760 Acute vaginitis: Secondary | ICD-10-CM

## 2017-05-04 ENCOUNTER — Other Ambulatory Visit: Payer: Self-pay | Admitting: Physician Assistant

## 2017-05-04 MED ORDER — CLINDAMYCIN PHOSPHATE 2 % VA CREA: 1.0000 | TOPICAL_CREAM | Freq: Every day | VAGINAL | 0 refills | Status: DC

## 2017-05-04 NOTE — Progress Notes (Signed)
Metronidazole gel too expensive. Sent clindamycin. She cannot tolerate oral metronidazole.

## 2017-06-16 ENCOUNTER — Ambulatory Visit (INDEPENDENT_AMBULATORY_CARE_PROVIDER_SITE_OTHER): Payer: BLUE CROSS/BLUE SHIELD | Admitting: Physician Assistant

## 2017-06-16 ENCOUNTER — Encounter: Payer: Self-pay | Admitting: Physician Assistant

## 2017-06-16 VITALS — BP 106/68 | HR 87 | Ht 63.0 in | Wt 131.0 lb

## 2017-06-16 DIAGNOSIS — F909 Attention-deficit hyperactivity disorder, unspecified type: Secondary | ICD-10-CM

## 2017-06-16 DIAGNOSIS — F341 Dysthymic disorder: Secondary | ICD-10-CM | POA: Diagnosis not present

## 2017-06-16 DIAGNOSIS — F419 Anxiety disorder, unspecified: Secondary | ICD-10-CM | POA: Diagnosis not present

## 2017-06-16 MED ORDER — AMPHETAMINE-DEXTROAMPHETAMINE 20 MG PO TABS: 20.0000 mg | ORAL_TABLET | Freq: Every day | ORAL | 0 refills | Status: DC

## 2017-06-16 MED ORDER — ESCITALOPRAM OXALATE 5 MG PO TABS: 5.0000 mg | ORAL_TABLET | Freq: Every day | ORAL | 1 refills | Status: DC

## 2017-06-16 MED ORDER — AMPHETAMINE-DEXTROAMPHET ER 30 MG PO CP24: 30.0000 mg | ORAL_CAPSULE | Freq: Every day | ORAL | 0 refills | Status: DC

## 2017-06-16 NOTE — Progress Notes (Signed)
Subjective:    Patient ID: Melanie Fritz, female    DOB: 08-May-1985, 32 y.o.   MRN: [id number]  HPI Patient is a 32 year old female who presents to the clinic to follow-up on ADHD and depression/anxiety.  ADHD-she is previously been on immediate release Adderall twice a day for the last year or so.  She would like to change up this regimen.  She is interested in extended release with an as needed afternoon dose of immediate release.  She denies any side effects of Adderall.  She denies any problems sleeping, palpitations, headaches.   she is concerned because she feels like the immediate relief is wearing off too soon.    Anxiety and depression is wonderful.  She has no complaints or concerns.  She denies any suicidal thoughts or homicidal ideation with.  She is even interested in perhaps stopping Lexapro.  She would like to discuss today.  .. Active Ambulatory Problems    Diagnosis Date Noted  . Anxiety 04/13/2012  . History of Reoccuring BV 05/03/2012  . Bacterial vaginosis 05/18/2012  . Depression 06/06/2012  . Sprain of ankle, right 09/05/2012  . Adult ADHD 07/13/2014  . Left breast mass 05/09/2015  . Muscle spasm 02/23/2017   Resolved Ambulatory Problems    Diagnosis Date Noted  . No Resolved Ambulatory Problems   Past Medical History:  Diagnosis Date  . Anxiety 04/13/2012      Review of Systems  All other systems reviewed and are negative.      Objective:   Physical Exam  Constitutional: She is oriented to person, place, and time. She appears well-developed and well-nourished.  HENT:  Head: Normocephalic and atraumatic.  Cardiovascular: Normal rate, regular rhythm and normal heart sounds.  Pulmonary/Chest: Effort normal and breath sounds normal.  Neurological: She is alert and oriented to person, place, and time.  Psychiatric: She has a normal mood and affect. Her behavior is normal.          Assessment & Plan:  Marland KitchenMarland KitchenElliet was seen today for adhd, anxiety  and depression.  Diagnoses and all orders for this visit:  Adult ADHD  Anxiety -     escitalopram (LEXAPRO) 5 MG tablet; Take 1 tablet (5 mg total) by mouth daily.  Dysthymia -     escitalopram (LEXAPRO) 5 MG tablet; Take 1 tablet (5 mg total) by mouth daily.  Other orders -     amphetamine-dextroamphetamine (ADDERALL XR) 30 MG 24 hr capsule; Take 1 capsule (30 mg total) by mouth daily. -     amphetamine-dextroamphetamine (ADDERALL) 20 MG tablet; Take 1 tablet (20 mg total) by mouth daily. Take at 1pm.   .. Depression screen [facility] 2/9 06/16/2017 02/23/2017 08/05/2016  Decreased Interest 0 1 0  Down, Depressed, Hopeless 0 1 0  PHQ - 2 Score 0 2 0  Altered sleeping 0 1 -  Tired, decreased energy 0 1 -  Change in appetite 0 1 -  Feeling bad or failure about yourself  0 1 -  Trouble concentrating 1 0 -  Moving slowly or fidgety/restless 0 0 -  Suicidal thoughts 0 0 -  PHQ-9 Score 1 6 -  Difficult doing work/chores Not difficult at all - -   .Marland Kitchen GAD 7 : Generalized Anxiety Score 06/16/2017 02/23/2017  Nervous, Anxious, on Edge 0 2  Control/stop worrying 0 2  Worry too much - different things 0 2  Trouble relaxing 0 2  Restless 0 1  Easily annoyed or irritable 0 0  Afraid - awful might happen 0 0  Total GAD 7 Score 0 9    I did decide to switch her immediate release to extended release.  Placed her on Adderall XR 30 mg with a immediate release around 1 PM.  Patient will my chart or call back with follow-up in 1 month.  Will then refill for follow-up in 3 months.  Patient is doing really great with Lexapro.  I am hesitant to change this at this time.  I discussed the importance of stabilization with her mood.  If in 6 months she is still doing great we could consider cutting Lexapro in half for 6-8 weeks and then consider stopping.  Follow-up as needed.

## 2017-06-18 ENCOUNTER — Ambulatory Visit: Payer: BLUE CROSS/BLUE SHIELD | Admitting: Physician Assistant

## 2017-06-22 ENCOUNTER — Encounter: Payer: Self-pay | Admitting: Physician Assistant

## 2017-06-25 MED ORDER — AMPHETAMINE-DEXTROAMPHETAMINE 15 MG PO TABS: 15.0000 mg | ORAL_TABLET | Freq: Every day | ORAL | 0 refills | Status: DC

## 2017-07-07 ENCOUNTER — Encounter: Payer: Self-pay | Admitting: Physician Assistant

## 2017-07-19 ENCOUNTER — Encounter: Payer: Self-pay | Admitting: Physician Assistant

## 2017-07-20 ENCOUNTER — Other Ambulatory Visit: Payer: Self-pay | Admitting: Physician Assistant

## 2017-07-20 MED ORDER — AMPHETAMINE-DEXTROAMPHETAMINE 20 MG PO TABS: 20.0000 mg | ORAL_TABLET | Freq: Three times a day (TID) | ORAL | 0 refills | Status: DC

## 2017-08-15 ENCOUNTER — Encounter: Payer: Self-pay | Admitting: Physician Assistant

## 2017-08-23 MED ORDER — AMPHETAMINE-DEXTROAMPHETAMINE 20 MG PO TABS: 20.0000 mg | ORAL_TABLET | Freq: Three times a day (TID) | ORAL | 0 refills | Status: DC

## 2017-10-01 ENCOUNTER — Encounter: Payer: Self-pay | Admitting: Physician Assistant

## 2017-10-11 ENCOUNTER — Encounter: Payer: Self-pay | Admitting: Physician Assistant

## 2017-10-11 MED ORDER — AMPHETAMINE-DEXTROAMPHETAMINE 20 MG PO TABS: 20.0000 mg | ORAL_TABLET | Freq: Three times a day (TID) | ORAL | 0 refills | Status: DC

## 2017-10-11 NOTE — Telephone Encounter (Signed)
Can we call pharmacy and cancel prescription and then set up EMR for new pharmacy. please

## 2017-10-12 NOTE — Telephone Encounter (Addendum)
Pharmacy updated in chart and patient will get new script at [facility] at next visit.

## 2017-11-01 ENCOUNTER — Ambulatory Visit (INDEPENDENT_AMBULATORY_CARE_PROVIDER_SITE_OTHER): Payer: Self-pay | Admitting: Physician Assistant

## 2017-11-01 ENCOUNTER — Encounter: Payer: Self-pay | Admitting: Physician Assistant

## 2017-11-01 VITALS — BP 101/68 | HR 88 | Ht 62.99 in | Wt 139.0 lb

## 2017-11-01 DIAGNOSIS — F909 Attention-deficit hyperactivity disorder, unspecified type: Secondary | ICD-10-CM

## 2017-11-01 DIAGNOSIS — F419 Anxiety disorder, unspecified: Secondary | ICD-10-CM

## 2017-11-01 DIAGNOSIS — Z79899 Other long term (current) drug therapy: Secondary | ICD-10-CM

## 2017-11-01 DIAGNOSIS — Z131 Encounter for screening for diabetes mellitus: Secondary | ICD-10-CM

## 2017-11-01 DIAGNOSIS — F341 Dysthymic disorder: Secondary | ICD-10-CM

## 2017-11-01 LAB — COMPLETE METABOLIC PANEL WITH GFR
AG Ratio: 1.8 (calc) (ref 1.0–2.5)
ALBUMIN MSPROF: 4.3 g/dL (ref 3.6–5.1)
ALKALINE PHOSPHATASE (APISO): 57 U/L (ref 33–115)
ALT: 13 U/L (ref 6–29)
AST: 17 U/L (ref 10–30)
BILIRUBIN TOTAL: 0.9 mg/dL (ref 0.2–1.2)
BUN: 10 mg/dL (ref 7–25)
CHLORIDE: 103 mmol/L (ref 98–110)
CO2: 29 mmol/L (ref 20–32)
CREATININE: 0.75 mg/dL (ref 0.50–1.10)
Calcium: 9.4 mg/dL (ref 8.6–10.2)
GFR, EST AFRICAN AMERICAN: 124 mL/min/{1.73_m2} (ref >=60)
GFR, Est Non African American: 107 mL/min/{1.73_m2} (ref >=60)
GLOBULIN: 2.4 g/dL (ref 1.9–3.7)
GLUCOSE: 67 mg/dL (ref 65–99)
Potassium: 3.9 mmol/L (ref 3.5–5.3)
SODIUM: 141 mmol/L (ref 135–146)
TOTAL PROTEIN: 6.7 g/dL (ref 6.1–8.1)

## 2017-11-01 MED ORDER — AMPHETAMINE-DEXTROAMPHETAMINE 20 MG PO TABS: 20.0000 mg | ORAL_TABLET | Freq: Two times a day (BID) | ORAL | 0 refills | Status: DC

## 2017-11-01 MED ORDER — ESCITALOPRAM OXALATE 5 MG PO TABS: 5.0000 mg | ORAL_TABLET | Freq: Every day | ORAL | 1 refills | Status: DC

## 2017-11-01 NOTE — Progress Notes (Signed)
Subjective:    Patient ID: Melanie Fritz, female    DOB: 12/21/1985, 32 y.o.   MRN: [id number]  HPI Pt is a 32 yo female with ADHD, anxiety, dysthymia who presents to the clinic for 3 month follow up.   ADHD- doing well with no concerns. She is taking adderall 20mg  bid. No palpitations, headaches, vision changes. Her energy level is good. She is doing well with productivity.   Her anxiety and dysthymia is doing well. She has no concerns or complaints. No SI/HC.   Marland Kitchen.. Active Ambulatory Problems    Diagnosis Date Noted  . Anxiety 04/13/2012  . History of Reoccuring BV 05/03/2012  . Bacterial vaginosis 05/18/2012  . Depression 06/06/2012  . Sprain of ankle, right 09/05/2012  . Adult ADHD 07/13/2014  . Left breast mass 05/09/2015  . Muscle spasm 02/23/2017   Resolved Ambulatory Problems    Diagnosis Date Noted  . No Resolved Ambulatory Problems   Past Medical History:  Diagnosis Date  . Anxiety 04/13/2012      Review of Systems  All other systems reviewed and are negative.      Objective:   Physical Exam  Constitutional: She is oriented to person, place, and time. She appears well-developed and well-nourished.  HENT:  Head: Normocephalic and atraumatic.  Cardiovascular: Normal rate and regular rhythm.  Pulmonary/Chest: Effort normal and breath sounds normal.  Neurological: She is alert and oriented to person, place, and time.  Psychiatric: She has a normal mood and affect. Her behavior is normal.          Assessment & Plan:  Marland Kitchen.Marland Kitchen.Melanie Fritz was seen today for follow-up.  Diagnoses and all orders for this visit:  Adult ADHD  Anxiety -     escitalopram (LEXAPRO) 5 MG tablet; Take 1 tablet (5 mg total) by mouth daily.  Dysthymia -     escitalopram (LEXAPRO) 5 MG tablet; Take 1 tablet (5 mg total) by mouth daily.  Medication management -     COMPLETE METABOLIC PANEL WITH GFR  Screening for diabetes mellitus -     COMPLETE METABOLIC PANEL WITH GFR  Other  orders -     amphetamine-dextroamphetamine (ADDERALL) 20 MG tablet; Take 1 tablet (20 mg total) by mouth 2 (two) times daily. -     amphetamine-dextroamphetamine (ADDERALL) 20 MG tablet; Take 1 tablet (20 mg total) by mouth 2 (two) times daily. -     amphetamine-dextroamphetamine (ADDERALL) 20 MG tablet; Take 1 tablet (20 mg total) by mouth 2 (two) times daily.   .. [facility] 11/01/2017 06/16/2017 06/16/2017 02/23/2017 08/05/2016  Decreased Interest 0 0 0 1 0  Down, Depressed, Hopeless 0 2 0 1 0  PHQ - 2 Score 0 2 0 2 0  Altered sleeping 0 2 0 1 -  Tired, decreased energy 0 2 0 1 -  Change in appetite 0 0 0 1 -  Feeling bad or failure about yourself  0 0 0 1 -  Trouble concentrating 0 0 1 0 -  Moving slowly or fidgety/restless 0 0 0 0 -  Suicidal thoughts 0 0 0 0 -  PHQ-9 Score 0 6 1 6  -  Difficult doing work/chores - Not difficult at all Not difficult at all - -   .Marland Kitchen. GAD 7 : Generalized Anxiety Score 11/01/2017 06/16/2017 06/16/2017 02/23/2017  Nervous, Anxious, on Edge 0 1 0 2  Control/stop worrying 0 0 0 2  Worry too much - different things 0 0 0 2  Trouble relaxing 0 0 0 2  Restless 0 0 0 1  Easily annoyed or irritable 0 0 0 0  Afraid - awful might happen 0 0 0 0  Total GAD 7 Score 0 1 0 9  Anxiety Difficulty - Not difficult at all - -    Refilled Adderall for 3 months.   lexapro refilled. Mood is doing awesome!  Screening cmp ordered.

## 2017-11-02 NOTE — Progress Notes (Signed)
Call pt: sugar, kidney, liver look great!

## 2017-11-11 ENCOUNTER — Encounter: Payer: Self-pay | Admitting: Physician Assistant

## 2017-11-12 MED ORDER — AMPHETAMINE-DEXTROAMPHETAMINE 20 MG PO TABS: 20.0000 mg | ORAL_TABLET | Freq: Three times a day (TID) | ORAL | 0 refills | Status: DC

## 2017-11-22 ENCOUNTER — Ambulatory Visit: Payer: BLUE CROSS/BLUE SHIELD | Admitting: Physician Assistant

## 2017-12-12 ENCOUNTER — Other Ambulatory Visit: Payer: Self-pay | Admitting: Physician Assistant

## 2017-12-17 DIAGNOSIS — K047 Periapical abscess without sinus: Secondary | ICD-10-CM | POA: Diagnosis not present

## 2017-12-17 DIAGNOSIS — K0889 Other specified disorders of teeth and supporting structures: Secondary | ICD-10-CM | POA: Diagnosis not present

## 2017-12-21 ENCOUNTER — Encounter: Payer: Self-pay | Admitting: Physician Assistant

## 2017-12-21 MED ORDER — METRONIDAZOLE 0.75 % VA GEL: 1.0000 | Freq: Every day | VAGINAL | 0 refills | Status: DC

## 2018-02-02 ENCOUNTER — Other Ambulatory Visit: Payer: Self-pay

## 2018-02-02 ENCOUNTER — Ambulatory Visit (INDEPENDENT_AMBULATORY_CARE_PROVIDER_SITE_OTHER): Payer: BLUE CROSS/BLUE SHIELD | Admitting: Physician Assistant

## 2018-02-02 ENCOUNTER — Encounter: Payer: Self-pay | Admitting: Physician Assistant

## 2018-02-02 VITALS — BP 113/77 | HR 82 | Ht 62.99 in | Wt 139.0 lb

## 2018-02-02 DIAGNOSIS — B9689 Other specified bacterial agents as the cause of diseases classified elsewhere: Secondary | ICD-10-CM

## 2018-02-02 DIAGNOSIS — F909 Attention-deficit hyperactivity disorder, unspecified type: Secondary | ICD-10-CM | POA: Diagnosis not present

## 2018-02-02 DIAGNOSIS — N760 Acute vaginitis: Secondary | ICD-10-CM

## 2018-02-02 MED ORDER — AMPHETAMINE-DEXTROAMPHETAMINE 20 MG PO TABS: 20.0000 mg | ORAL_TABLET | Freq: Three times a day (TID) | ORAL | 0 refills | Status: DC

## 2018-02-02 NOTE — Progress Notes (Signed)
   Subjective:    Patient ID: Melanie Fritz, female    DOB: Jan 15, 1986, 32 y.o.   MRN: [id number]  HPI Pt is a 32 yo female with ADHD who presents to the clinic for 3 month follow up.   Pt is doing great on current dose. No problems at work. Denies any anxiety, palpitations, or insomnia.   Hx of BV. Usually gets after period. Wants to discuss prevention.   .. Active Ambulatory Problems    Diagnosis Date Noted  . Anxiety 04/13/2012  . History of Reoccuring BV 05/03/2012  . Bacterial vaginosis 05/18/2012  . Depression 06/06/2012  . Sprain of ankle, right 09/05/2012  . Adult ADHD 07/13/2014  . Left breast mass 05/09/2015  . Muscle spasm 02/23/2017   Resolved Ambulatory Problems    Diagnosis Date Noted  . No Resolved Ambulatory Problems   No Additional Past Medical History      Review of Systems See HPI.     Objective:   Physical Exam  Constitutional: She is oriented to person, place, and time. She appears well-developed and well-nourished.  HENT:  Head: Normocephalic and atraumatic.  Cardiovascular: Normal rate and regular rhythm.  Pulmonary/Chest: Effort normal and breath sounds normal.  Neurological: She is alert and oriented to person, place, and time.  Psychiatric: She has a normal mood and affect. Her behavior is normal.          Assessment & Plan:  Marland KitchenMarland KitchenDiagnoses and all orders for this visit:  Adult ADHD  History of Reoccuring BV  Other orders -     Discontinue: amphetamine-dextroamphetamine (ADDERALL) 20 MG tablet; Take 1 tablet (20 mg total) by mouth 3 (three) times daily. -     Discontinue: amphetamine-dextroamphetamine (ADDERALL) 20 MG tablet; Take 1 tablet (20 mg total) by mouth 3 (three) times daily. -     Discontinue: amphetamine-dextroamphetamine (ADDERALL) 20 MG tablet; Take 1 tablet (20 mg total) by mouth 3 (three) times daily.  refilled for 3 months adderall.   HO given for BV. Discussed boric acid for prevention. HO given. Follow up as needed.

## 2018-02-02 NOTE — Telephone Encounter (Signed)
Anahi called and states the Adderall was sent to the wrong pharmacy. I called and cancelled [facility] [address]. [facility] wants [facility] to [facility].

## 2018-02-04 ENCOUNTER — Ambulatory Visit: Payer: BLUE CROSS/BLUE SHIELD | Admitting: Physician Assistant

## 2018-03-07 ENCOUNTER — Encounter: Payer: Self-pay | Admitting: Physician Assistant

## 2018-03-07 ENCOUNTER — Ambulatory Visit (INDEPENDENT_AMBULATORY_CARE_PROVIDER_SITE_OTHER): Payer: BLUE CROSS/BLUE SHIELD | Admitting: Physician Assistant

## 2018-03-07 VITALS — BP 122/90 | HR 97 | Temp 98.6°F | Ht 62.99 in | Wt 136.0 lb

## 2018-03-07 DIAGNOSIS — J0140 Acute pansinusitis, unspecified: Secondary | ICD-10-CM

## 2018-03-07 MED ORDER — AMOXICILLIN-POT CLAVULANATE 875-125 MG PO TABS: 1.0000 | ORAL_TABLET | Freq: Two times a day (BID) | ORAL | 0 refills | Status: DC

## 2018-03-07 NOTE — Patient Instructions (Addendum)
Tylenol cold sinus severe(tylenol, mucinex, sudafed) and start flonase.    Sinusitis, Adult Sinusitis is soreness and inflammation of your sinuses. Sinuses are hollow spaces in the bones around your face. They are located:  Around your eyes.  In the middle of your forehead.  Behind your nose.  In your cheekbones.  Your sinuses and nasal passages are lined with a stringy fluid (mucus). Mucus normally drains out of your sinuses. When your nasal tissues get inflamed or swollen, the mucus can get trapped or blocked so air cannot flow through your sinuses. This lets bacteria, viruses, and funguses grow, and that leads to infection. Follow these instructions at home: Medicines  Take, use, or apply over-the-counter and prescription medicines only as told by your doctor. These may include nasal sprays.  If you were prescribed an antibiotic medicine, take it as told by your doctor. Do not stop taking the antibiotic even if you start to feel better. Hydrate and Humidify  Drink enough water to keep your pee (urine) clear or pale yellow.  Use a cool mist humidifier to keep the humidity level in your home above 50%.  Breathe in steam for 10-15 minutes, 3-4 times a day or as told by your doctor. You can do this in the bathroom while a hot shower is running.  Try not to spend time in cool or dry air. Rest  Rest as much as possible.  Sleep with your head raised (elevated).  Make sure to get enough sleep each night. General instructions  Put a warm, moist washcloth on your face 3-4 times a day or as told by your doctor. This will help with discomfort.  Wash your hands often with soap and water. If there is no soap and water, use hand sanitizer.  Do not smoke. Avoid being around people who are smoking (secondhand smoke).  Keep all follow-up visits as told by your doctor. This is important. Contact a doctor if:  You have a fever.  Your symptoms get worse.  Your symptoms do not get  better within 10 days. Get help right away if:  You have a very bad headache.  You cannot stop throwing up (vomiting).  You have pain or swelling around your face or eyes.  You have trouble seeing.  You feel confused.  Your neck is stiff.  You have trouble breathing. This information is not intended to replace advice given to you by your health care provider. Make sure you discuss any questions you have with your health care provider. Document Released: 09/30/2007 Document Revised: 12/08/2015 Document Reviewed: 02/06/2015 Elsevier Interactive Patient Education  [employer].

## 2018-03-07 NOTE — Progress Notes (Signed)
   Subjective:    Patient ID: Melanie Fritz, female    DOB: 1986/01/17, 32 y.o.   MRN: [id number]  HPI Pt is a 32 yo female who presents to the clinic with 5 days of sinus pressure, cough, sputum production, chest tightness, cough. She has no other sick contacts. 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 She has been using mucinex and resting. She did run a fever of 101 over the weekend.   .. Active Ambulatory Problems    Diagnosis Date Noted  . Anxiety 04/13/2012  . History of Reoccuring BV 05/03/2012  . Bacterial vaginosis 05/18/2012  . Depression 06/06/2012  . Sprain of ankle, right 09/05/2012  . Adult ADHD 07/13/2014  . Left breast mass 05/09/2015  . Muscle spasm 02/23/2017   Resolved Ambulatory Problems    Diagnosis Date Noted  . No Resolved Ambulatory Problems   No Additional Past Medical History      Review of Systems    see HPI.  Objective:   Physical Exam  Constitutional: She is oriented to person, place, and time. She appears well-developed and well-nourished.  HENT:  Head: Normocephalic and atraumatic.  Right Ear: External ear normal.  Left Ear: External ear normal.  Mouth/Throat: No oropharyngeal exudate.  TM's clear.  Oropharynx erythematous with PND. No tonsilar swelling or exudate.  Bilateral nasal turbinates red and swollen.   Eyes: Conjunctivae are normal. Right eye exhibits no discharge. Left eye exhibits no discharge.  Neck: Normal range of motion.  Cardiovascular: Normal rate and regular rhythm.  Pulmonary/Chest: Effort normal and breath sounds normal. She has no wheezes.  Lymphadenopathy:    She has no cervical adenopathy.  Neurological: She is alert and oriented to person, place, and time.  Psychiatric: She has a normal mood and affect. Her behavior is normal.          Assessment & Plan:  Marland KitchenMarland KitchenDiagnoses and all orders for this visit:  Acute non-recurrent pansinusitis -     amoxicillin-clavulanate (AUGMENTIN) 875-125 MG tablet; Take 1  tablet by mouth 2 (two) times daily.   Pt almost at the week mark. Hold abx for another 24 hours at least. Start tylenol cold sinus severe and flonase. Rest and hydrate. Consider sinus rinses. Written out of work for today. Follow up as needed.

## 2018-03-07 NOTE — Progress Notes (Signed)
   Subjective:    Patient ID: Melanie Fritz, female    DOB: [birth year], 32 y.o.   MRN: [id number]  HPI  Pt is a 32 yo female who presents to the clinic with 5 days of sinus pressure, cough, sputum production, chest tightness, cough. She has no other sick contacts. 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  Started Friday evening. Took tylenol. Fever, chills, head pessure, green yellow sputum.  Go back today.  Mucinex.    Review of Systems     Objective:   Physical Exam        Assessment & Plan:

## 2018-03-09 ENCOUNTER — Encounter: Payer: Self-pay | Admitting: Physician Assistant

## 2018-03-09 MED ORDER — AMPHETAMINE-DEXTROAMPHETAMINE 20 MG PO TABS: 20.0000 mg | ORAL_TABLET | Freq: Three times a day (TID) | ORAL | 0 refills | Status: DC

## 2018-04-11 ENCOUNTER — Encounter: Payer: Self-pay | Admitting: Physician Assistant

## 2018-05-03 ENCOUNTER — Encounter: Payer: Self-pay | Admitting: Physician Assistant

## 2018-05-03 ENCOUNTER — Ambulatory Visit (INDEPENDENT_AMBULATORY_CARE_PROVIDER_SITE_OTHER): Payer: BLUE CROSS/BLUE SHIELD | Admitting: Physician Assistant

## 2018-05-03 VITALS — BP 126/92 | HR 117 | Ht 62.99 in | Wt 139.0 lb

## 2018-05-03 DIAGNOSIS — F341 Dysthymic disorder: Secondary | ICD-10-CM | POA: Diagnosis not present

## 2018-05-03 DIAGNOSIS — F909 Attention-deficit hyperactivity disorder, unspecified type: Secondary | ICD-10-CM | POA: Diagnosis not present

## 2018-05-03 DIAGNOSIS — G479 Sleep disorder, unspecified: Secondary | ICD-10-CM | POA: Diagnosis not present

## 2018-05-03 DIAGNOSIS — R253 Fasciculation: Secondary | ICD-10-CM

## 2018-05-03 DIAGNOSIS — F419 Anxiety disorder, unspecified: Secondary | ICD-10-CM

## 2018-05-03 MED ORDER — AMPHETAMINE-DEXTROAMPHETAMINE 30 MG PO TABS: 30.0000 mg | ORAL_TABLET | Freq: Two times a day (BID) | ORAL | 0 refills | Status: DC

## 2018-05-03 MED ORDER — CLONAZEPAM 0.5 MG PO TABS: 0.5000 mg | ORAL_TABLET | Freq: Two times a day (BID) | ORAL | 1 refills | Status: DC | PRN

## 2018-05-03 NOTE — Progress Notes (Signed)
Subjective:    Patient ID: Melanie Fritz, female    DOB: October 26, 1985, 33 y.o.   MRN: [id number]  HPI Pt is a 33 yo female with ADHD, anxiety, depression who presents to the clinic for follow-up and medication refills  Patient is taking Adderall with no side effects that she is aware of.  She has been on this medication for a while.  She does request to go to twice a day instead of the 3 times a day.  She denies any chest pains, palpitations, headaches, vision changes.  She is having some sleep issues.  She has been more recently noted in the last month or so.  Has not seen been since the start of a stimulant.  She is concerned it could be her Lexapro.  She is also having some nausea and some twitching at night.  She does notice she has some worsening anxiety about 2-3 times a week.  In the past she was given a medicine to help with this.  She wonders if she could have that today.  She denies any suicidal thoughts or homicidal idealizations.  Overall she is doing well her job is going well.  .. Active Ambulatory Problems    Diagnosis Date Noted  . Anxiety 04/13/2012  . History of Reoccuring BV 05/03/2012  . Bacterial vaginosis 05/18/2012  . Depression 06/06/2012  . Sprain of ankle, right 09/05/2012  . Adult ADHD 07/13/2014  . Left breast mass 05/09/2015  . Muscle spasm 02/23/2017  . Trouble in sleeping 05/03/2018  . Muscle twitching 05/03/2018   Resolved Ambulatory Problems    Diagnosis Date Noted  . No Resolved Ambulatory Problems   No Additional Past Medical History     Review of Systems  All other systems reviewed and are negative.      Objective:   Physical Exam Vitals signs reviewed.  Constitutional:      Appearance: Normal appearance.  HENT:     Head: Atraumatic.  Cardiovascular:     Rate and Rhythm: Normal rate and regular rhythm.  Pulmonary:     Effort: Pulmonary effort is normal.     Breath sounds: Normal breath sounds.  Neurological:     General: No focal  deficit present.     Mental Status: She is alert and oriented to person, place, and time.  Psychiatric:        Mood and Affect: Mood normal.        Behavior: Behavior normal.           Assessment & Plan:   Marland KitchenMarland KitchenAntoinnette was seen today for follow-up.  Diagnoses and all orders for this visit:  Anxiety -     clonazePAM (KLONOPIN) 0.5 MG tablet; Take 1 tablet (0.5 mg total) by mouth 2 (two) times daily as needed for anxiety.  Dysthymia  Adult ADHD -     amphetamine-dextroamphetamine (ADDERALL) 30 MG tablet; Take 1 tablet by mouth 2 (two) times daily. -     amphetamine-dextroamphetamine (ADDERALL) 30 MG tablet; Take 1 tablet by mouth 2 (two) times daily. -     amphetamine-dextroamphetamine (ADDERALL) 30 MG tablet; Take 1 tablet by mouth 2 (two) times daily.  Trouble in sleeping  Muscle twitching    .Marland Kitchen Depression screen [facility] 2/9 05/03/2018 11/01/2017 06/16/2017 06/16/2017 02/23/2017  Decreased Interest 0 0 0 0 1  Down, Depressed, Hopeless 0 0 2 0 1  PHQ - 2 Score 0 0 2 0 2  Altered sleeping 1 0 2 0 1  Tired, decreased  energy 1 0 2 0 1  Change in appetite 0 0 0 0 1  Feeling bad or failure about yourself  0 0 0 0 1  Trouble concentrating 0 0 0 1 0  Moving slowly or fidgety/restless 0 0 0 0 0  Suicidal thoughts 0 0 0 0 0  PHQ-9 Score 2 0 6 1 6   Difficult doing work/chores Not difficult at all - Not difficult at all Not difficult at all -   .. GAD 7 : Generalized Anxiety Score 05/03/2018 11/01/2017 06/16/2017 06/16/2017  Nervous, Anxious, on Edge 0 0 1 0  Control/stop worrying 0 0 0 0  Worry too much - different things 0 0 0 0  Trouble relaxing 0 0 0 0  Restless 0 0 0 0  Easily annoyed or irritable 0 0 0 0  Afraid - awful might happen 0 0 0 0  Total GAD 7 Score 0 0 1 0  Anxiety Difficulty Not difficult at all - Not difficult at all -    ADHD medications refilled for 3 months.  BP better on 2nd recheck. diastolic still elevated. Will watch no hx of this.   Stop lexapro and see if  nausea and leg twitching improves. Let me know. If anxiety worsens need to consider other options. Clonazepam given small quanity. Discussed abuse potential. Only use when needed for acute anxiety. Symptoms sound some like RLS. I gave HO. Consider melatonin. Work on Brewing technologistrelaxation techniques at bedtime.   Follow up in 3 months or sooner if needed.

## 2018-05-03 NOTE — Patient Instructions (Signed)

## 2018-05-25 ENCOUNTER — Encounter: Payer: Self-pay | Admitting: Physician Assistant

## 2018-05-27 MED ORDER — PAROXETINE HCL 10 MG PO TABS: 10.0000 mg | ORAL_TABLET | Freq: Every day | ORAL | 1 refills | Status: DC

## 2018-06-03 ENCOUNTER — Ambulatory Visit: Payer: BLUE CROSS/BLUE SHIELD | Admitting: Physician Assistant

## 2018-07-20 ENCOUNTER — Encounter: Payer: Self-pay | Admitting: Physician Assistant

## 2018-07-25 ENCOUNTER — Encounter: Payer: Self-pay | Admitting: Physician Assistant

## 2018-07-25 ENCOUNTER — Other Ambulatory Visit: Payer: Self-pay | Admitting: Physician Assistant

## 2018-07-25 ENCOUNTER — Other Ambulatory Visit: Payer: Self-pay

## 2018-07-25 ENCOUNTER — Ambulatory Visit (INDEPENDENT_AMBULATORY_CARE_PROVIDER_SITE_OTHER): Payer: BLUE CROSS/BLUE SHIELD | Admitting: Physician Assistant

## 2018-07-25 VITALS — BP 122/74 | Temp 98.4°F | Wt 135.0 lb

## 2018-07-25 DIAGNOSIS — F909 Attention-deficit hyperactivity disorder, unspecified type: Secondary | ICD-10-CM

## 2018-07-25 DIAGNOSIS — F419 Anxiety disorder, unspecified: Secondary | ICD-10-CM | POA: Diagnosis not present

## 2018-07-25 DIAGNOSIS — F341 Dysthymic disorder: Secondary | ICD-10-CM

## 2018-07-25 MED ORDER — VORTIOXETINE HBR 5 MG PO TABS: 5.0000 mg | ORAL_TABLET | Freq: Every day | ORAL | 1 refills | Status: DC

## 2018-07-25 MED ORDER — AMPHETAMINE-DEXTROAMPHETAMINE 30 MG PO TABS: 30.0000 mg | ORAL_TABLET | Freq: Two times a day (BID) | ORAL | 0 refills | Status: DC

## 2018-07-25 NOTE — Progress Notes (Signed)
..Virtual Visit via Telephone Note  I connected with Melanie Fritz on 07/25/18 at  8:50 AM EDT by telephone and verified that I am speaking with the correct person using two identifiers.   I discussed the limitations, risks, security and privacy concerns of performing an evaluation and management service by telephone and the availability of in person appointments. I also discussed with the patient that there may be a patient responsible charge related to this service. The patient expressed understanding and agreed to proceed.   History of Present Illness: Pt is a 33 yo female with ADHD, anxiety and depression who calls into clinic to follow up and get medication refills.   Pt is doing well with focus. She remains working through the [employer]. She has no concerns. No increase in insomnia.   She does not like paxil. Seems to make her have a headache. She does feel like it helps her anxiety and depression though. She wants to try another medication.   .. Active Ambulatory Problems    Diagnosis Date Noted  . Anxiety 04/13/2012  . History of Reoccuring BV 05/03/2012  . Bacterial vaginosis 05/18/2012  . Depression 06/06/2012  . Sprain of ankle, right 09/05/2012  . Adult ADHD 07/13/2014  . Left breast mass 05/09/2015  . Muscle spasm 02/23/2017  . Trouble in sleeping 05/03/2018  . Muscle twitching 05/03/2018   Resolved Ambulatory Problems    Diagnosis Date Noted  . No Resolved Ambulatory Problems   No Additional Past Medical History   Reviewed med, problem, allergy list.    Observations/Objective: No acute distress.   .. Today's Vitals   07/25/18 0851  BP: 122/74  Temp: 98.4 F (36.9 C)  TempSrc: Oral  Weight: 135 lb (61.2 kg)   Body mass index is 23.92 kg/m.  .. Depression screen [facility] 2/9 07/25/2018 05/03/2018 11/01/2017 06/16/2017 06/16/2017  Decreased Interest 0 0 0 0 0  Down, Depressed, Hopeless 0 0 0 2 0  PHQ - 2 Score 0 0 0 2 0  Altered sleeping 1 1 0 2 0  Tired,  decreased energy 0 1 0 2 0  Change in appetite 0 0 0 0 0  Feeling bad or failure about yourself  0 0 0 0 0  Trouble concentrating 0 0 0 0 1  Moving slowly or fidgety/restless 0 0 0 0 0  Suicidal thoughts 0 0 0 0 0  PHQ-9 Score 1 2 0 6 1  Difficult doing work/chores Somewhat difficult Not difficult at all - Not difficult at all Not difficult at all   .Marland Kitchen GAD 7 : Generalized Anxiety Score 07/25/2018 05/03/2018 11/01/2017 06/16/2017  Nervous, Anxious, on Edge 0 0 0 1  Control/stop worrying 0 0 0 0  Worry too much - different things 0 0 0 0  Trouble relaxing 0 0 0 0  Restless 0 0 0 0  Easily annoyed or irritable 0 0 0 0  Afraid - awful might happen 0 0 0 0  Total GAD 7 Score 0 0 0 1  Anxiety Difficulty Not difficult at all Not difficult at all - Not difficult at all     Assessment and Plan: Marland KitchenMarland KitchenJulius was seen today for medication management.  Diagnoses and all orders for this visit:  Anxiety  Adult ADHD  Dysthymia   Refilled adderall for 3 months.  ..PDMP reviewed during this encounter.  Stop paxil start trintellix. Pt has tried and failed lexapro, wellbutrin, paxil, effexor. Discussed side effects. May need to pick up coupon  card or look online. Follow up in 8 weeks.   Follow Up Instructions:    I discussed the assessment and treatment plan with the patient. The patient was provided an opportunity to ask questions and all were answered. The patient agreed with the plan and demonstrated an understanding of the instructions.   The patient was advised to call back or seek an in-person evaluation if the symptoms worsen or if the condition fails to improve as anticipated.  I provided 15 minutes of non-face-to-face time during this encounter.   Tandy Gaw, PA-C

## 2018-08-22 ENCOUNTER — Encounter: Payer: Self-pay | Admitting: Physician Assistant

## 2018-08-23 ENCOUNTER — Telehealth: Payer: Self-pay | Admitting: Physician Assistant

## 2018-08-23 MED ORDER — AMPHETAMINE-DEXTROAMPHETAMINE 20 MG PO TABS: 20.0000 mg | ORAL_TABLET | Freq: Three times a day (TID) | ORAL | 0 refills | Status: DC

## 2018-08-23 NOTE — Telephone Encounter (Signed)
Via [employer]. Pt requested to go back to 20mg  TID instead of 30mg  bid.   Sent new rx.   Spent 5 minutes is coordinated care for this patient.

## 2018-09-14 ENCOUNTER — Other Ambulatory Visit: Payer: Self-pay

## 2018-09-14 ENCOUNTER — Encounter: Payer: Self-pay | Admitting: Family Medicine

## 2018-09-14 ENCOUNTER — Ambulatory Visit (INDEPENDENT_AMBULATORY_CARE_PROVIDER_SITE_OTHER): Payer: BLUE CROSS/BLUE SHIELD

## 2018-09-14 ENCOUNTER — Ambulatory Visit: Payer: BLUE CROSS/BLUE SHIELD | Admitting: Family Medicine

## 2018-09-14 VITALS — BP 137/99 | HR 109 | Ht 62.0 in | Wt 148.0 lb

## 2018-09-14 DIAGNOSIS — M79644 Pain in right finger(s): Secondary | ICD-10-CM | POA: Diagnosis not present

## 2018-09-14 MED ORDER — TRAMADOL HCL 50 MG PO TABS: 50.0000 mg | ORAL_TABLET | Freq: Three times a day (TID) | ORAL | 0 refills | Status: DC | PRN

## 2018-09-14 NOTE — Progress Notes (Signed)
Melanie Fritz is a 33 y.o. female who presents to [facility] today for thumb pain.  Patient is right-hand dominant.  She injured her right thumb on Sunday  May 17 doing a cartwheel.  She had immediate thumb pain.  The pain is been worsening over the last several days and developed swelling.  She is tried a thumb spica splint which is helped a little.  She is also tried ibuprofen Tylenol and ice which helped a little.  She denies any puncture wound or breakage of the skin.  She feels well otherwise with no fevers or chills.  Thumb is quite painful.  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   ROS:  As above  Exam:  BP (!) 137/99   Pulse (!) 109   Ht 5\' 2"  (1.575 m)   Wt 148 lb (67.1 kg)   SpO2 100%   BMI 27.07 kg/m  Wt Readings from Last 5 Encounters:  09/14/18 148 lb (67.1 kg)  07/25/18 135 lb (61.2 kg)  05/03/18 139 lb (63 kg)  03/07/18 136 lb (61.7 kg)  02/02/18 139 lb (63 kg)   General: Well Developed, well nourished, and in no acute distress.  Neuro/Psych: Alert and oriented x3, extra-ocular muscles intact, able to move all 4 extremities, sensation grossly intact. Skin: Warm and dry, no rashes noted.  Respiratory: Not using accessory muscles, speaking in full sentences, trachea midline.  Cardiovascular: Pulses palpable, no extremity edema. Abdomen: Does not appear distended. MSK:  Right hand: Swollen erythematous right thumb from the thenar eminence to the interphalangeal joint.  Tender palpation at MCP and interphalangeal joint. Right hand otherwise normal-appearing Right wrist otherwise normal-appearing.  Not particularly tender at anatomical snuffbox or along dorsal volar wrist. Decreased hand and wrist motion. Sensation capillary fill and pulses are intact.    Lab and Radiology Results X-ray right hand images personally independently reviewed No fractures or malalignment.   Soft tissue swelling present. Await formal radiology review  Limited musculoskeletal ultrasound right thumb. Soft tissue swelling diffusely.  MCP joint visualized and small hyperechoic fleck present at the radial side of the joint consistent with avulsion fracture.  No obvious donor site present.  Ulnar-sided joint visualized.  No obvious tear of the ulnar collateral ligament however patient does have laxity with stress test.  No obvious fracture visible on exam today.  Patient was fitted with a well-formed thumb spica splint.  Assessment and Plan: 33 y.o. female with right thumb injury occurring a few days ago worsening.  Patient has evidence of injury to the soft tissue of her thumb.  She may have an avulsion fracture seen on ultrasound but not seen on x-ray.  She has significant guarding with exam.  Plan for thumb spica splint NSAIDs limited tramadol and recheck in about a week.   PDMP reviewed during this encounter. Orders Placed This Encounter  Procedures  . DG Hand Complete Right    Standing Status:   Future    Number of Occurrences:   1    Standing Expiration Date:   11/14/2019    Order Specific Question:   Reason for Exam (SYMPTOM  OR DIAGNOSIS REQUIRED)    Answer:   eval right thumb pain    Order Specific Question:   Is patient pregnant?    Answer:   No    Order Specific Question:   Preferred imaging location?    [place]   [facility]    Order Specific Question:   Radiology Contrast Protocol - do NOT remove file path    Answer:   [email or web address]   Meds ordered this encounter  Medications  . traMADol (ULTRAM) 50 MG tablet    Sig: Take 1 tablet (50 mg total) by mouth every 8 (eight) hours as needed for severe pain.    Dispense:  8 tablet    Refill:  0    Historical information moved to improve visibility of documentation.  Past Medical History:  Diagnosis Date  . Anxiety 04/13/2012   No past surgical history on file.  Social History   Tobacco Use  . Smoking status: Former Smoker    Years: 1.00    Last attempt to quit: 10/13/2010    Years since quitting: 7.9  . Smokeless tobacco: Never Used  Substance Use Topics  . Alcohol use: Yes   family history includes Diabetes in her father; Heart attack in her unknown relative; Hypertension in her mother; Leukemia in her unknown relative; Stroke in her unknown relative.  Medications: Current Outpatient Medications  Medication Sig Dispense Refill  . amphetamine-dextroamphetamine (ADDERALL) 20 MG tablet Take 1 tablet (20 mg total) by mouth 3 (three) times daily. 90 tablet 0  . traMADol (ULTRAM) 50 MG tablet Take 1 tablet (50 mg total) by mouth every 8 (eight) hours as needed for severe pain. 8 tablet 0   No current facility-administered medications for this visit.    Allergies  Allergen Reactions  . Adderall Xr [Amphetamine-Dextroamphet Er]     Only XR but gives her a headache.   . Effexor Xr [Venlafaxine Hcl Er]     fatigue  . Paroxetine Hcl Other (See Comments)    Headaches, insomnia, nausea  . Wellbutrin [Bupropion]     Unease      Discussed warning signs or symptoms. Please see discharge instructions. Patient expresses understanding.

## 2018-09-14 NOTE — Patient Instructions (Signed)
Thank you for coming in today.  Ok to take ibuprofen 600mg  (3 pills) every 8 hours for pain as needed.  Ok to take aleve 1-2 pills every 12 hours for pain as needed.  Use tramadol sparingly.  Use tramadol sparingly.  Recheck in 1 week.    Skier's Thumb  Skier's thumb is a stretched or torn ligament in the thumb from a sudden injury (acute injury). It is sometimes called gamekeeper's thumb if it developed gradually (chronic injury) from repeated overstretching of the ligaments. Ligaments are strong bands of tissue that connect bones. The ligament that is injured (ulnar collateral ligament) connects the bones that make up the joint at the base of the thumb. A tear can be either partial or complete. The severity of the injury depends on how much of the ligament was damaged or torn. If it is not treated properly, this injury can lead to arthritis. What are the causes? This condition occurs when the thumb is forcefully moved past its normal range of motion toward the wrist. It may be caused by:  Falling onto an outstretched hand. This often happens to skiers who fall with ski poles in their hands.  Repeated movements that use the thumb, like catching a ball or other object. What increases the risk? You are more likely to develop this condition if:  You had a previous thumb injury.  You play contact sports or sports that involve catching balls, such as baseball, basketball, or football.  You do activities that increase the chance that the thumb will be pulled away from the rest of the hand.  You have poor hand strength and flexibility.  You do not warm up properly before activities. What are the signs or symptoms? Symptoms of this condition include:  Pain or tenderness.  Swelling.  Trouble grasping or pinching with the injured thumb.  Bruising or redness. If the injury is severe, a lump (mass) may be felt under the skin in the injured area. How is this diagnosed? This condition  may be diagnosed based on:  Your symptoms and medical history.  A physical exam.  Imaging tests, such as X-rays, an ultrasound, or an MRI. How is this treated? Treatment for this condition depends on the severity of your injury.  If the ligament is overstretched or partially torn, treatment usually involves keeping your thumb in a fixed position (immobilization) for a period of time. Your health care provider will apply a brace, cast, or splint to keep your thumb from moving until it heals.  If the ligament is fully torn, you may need surgery to reconnect the ligament to the bone. After surgery, you will need to wear a cast or splint on your thumb. Your health care provider may also suggest exercises or physical therapy to strengthen your thumb. Follow these instructions at home: If you have a cast:  Do not stick anything inside the cast to scratch your skin. Doing that increases your risk of infection.  Check the skin around the cast every day. Tell your health care provider about any concerns.  You may put lotion on dry skin around the edges of the cast. Do not put lotion on the skin underneath the cast.  Keep the cast clean and dry. If you have a splint or brace:   Wear the splint or brace as told by your health care provider. Remove it only as told by your health care provider.  Loosen the splint or brace if your fingers tingle, become numb, or  turn cold and blue.  Keep the splint or brace clean and dry. Bathing  Do not take baths, swim, or use a hot tub until your health care provider approves. Ask your health care provider if you may take showers. You may only be allowed to take sponge baths.  If your cast, splint, or brace is not waterproof: ? Do not let it get wet. ? Cover it with a watertight covering when you take a bath or shower. Managing pain, stiffness, and swelling   If directed, put ice on the injured area: ? If you have a removable splint or brace, remove  it as told by your health care provider. ? Put ice in a plastic bag. ? Place a towel between your skin and the bag or between your cast and the bag. ? Leave the ice on for 20 minutes, 2-3 times a day.  Move your fingers often to avoid stiffness and to lessen swelling.  Raise (elevate) the injured area above the level of your heart while you are sitting or lying down. Driving  Do not drive or use heavy machinery while taking prescription pain medicine.  Ask your health care provider when it is safe to drive if you have a cast, splint, or brace on your hand. Activity  Return to your normal activities as told by your health care provider. Ask your health care provider what activities are safe for you.  Do exercises as told by your health care provider or physical therapist. General instructions  Do not put pressure on any part of the cast or splint until it is fully hardened. This may take several hours.  Do not wear rings on your injured thumb.  Take over-the-counter and prescription medicines only as told by your health care provider.  To prevent or treat constipation while you are taking prescription pain medicine, your health care provider may recommend that you: ? Drink enough fluid to keep your urine pale yellow. ? Eat foods that are high in fiber, such as beans, whole grains, and fresh fruits and vegetables. ? Limit foods that are high in fat and processed sugars, such as fried or sweet foods. ? Take an over-the-counter or prescription medicine for constipation.  Keep all follow-up visits as told by your health care provider. This is important. Contact a health care provider if:  Your pain is not controlled with medicine.  Your bruising or swelling gets worse.  Your cast or splint is damaged.  Your thumb is numb and feels colder to the touch than normal. Get help right away if:  You have severe pain.  Your thumb is pale or blue. Summary  Skier's thumb is a  stretched or torn ligament in the thumb.  This injury can happen suddenly (acute) or may develop gradually (chronic).  Treatment usually involves wearing a cast, splint, or brace on your thumb. Surgery may be needed if the ligament is fully torn. This information is not intended to replace advice given to you by your health care provider. Make sure you discuss any questions you have with your health care provider. Document Released: 04/13/2005 Document Revised: 08/04/2017 Document Reviewed: 08/04/2017 Elsevier Interactive Patient Education  2019 [employer]

## 2018-09-21 ENCOUNTER — Encounter: Payer: Self-pay | Admitting: Family Medicine

## 2018-09-21 ENCOUNTER — Ambulatory Visit (INDEPENDENT_AMBULATORY_CARE_PROVIDER_SITE_OTHER): Payer: BLUE CROSS/BLUE SHIELD | Admitting: Family Medicine

## 2018-09-21 VITALS — BP 127/82 | HR 101 | Temp 98.0°F | Wt 145.0 lb

## 2018-09-21 DIAGNOSIS — M79644 Pain in right finger(s): Secondary | ICD-10-CM | POA: Diagnosis not present

## 2018-09-21 NOTE — Progress Notes (Signed)
Melanie Fritz is a 33 y.o. female who [facility] today for follow-up right thumb pain.  Patient was seen 1 week ago for right thumb pain following a injury occurring while doing a cartwheel.  X-rays were unremarkable ultrasound showed a possible avulsion fragment and effusion of the MCP joint.  No definitive UCL or RCL tear.  She was treated with a custom thumb spica splint and is here today for follow-up.  In the interim she notes that her pain has improved but out of the splint is still somewhat present.  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  She is scheduled to return to work on June 1.    ROS:  As above  Exam:  BP 127/82   Pulse (!) 101   Temp 98 F (36.7 C) (Oral)   Wt 145 lb (65.8 kg)   BMI 26.52 kg/m  Wt Readings from Last 5 Encounters:  09/21/18 145 lb (65.8 kg)  09/14/18 148 lb (67.1 kg)  07/25/18 135 lb (61.2 kg)  05/03/18 139 lb (63 kg)  03/07/18 136 lb (61.7 kg)   General: Well Developed, well nourished, and in no acute distress.  Neuro/Psych: Alert and oriented x3, extra-ocular muscles intact, able to move all 4 extremities, sensation grossly intact. Skin: Warm and dry, no rashes noted.  Respiratory: Not using accessory muscles, speaking in full sentences, trachea midline.  Cardiovascular: Pulses palpable, no extremity edema. Abdomen: Does not appear distended. MSK: Right thumb still slightly swollen at MCP.  Otherwise normal-appearing. Tender to palpation at ulnar side of MCP. No severe laxity but some pain with ulnar stress test. Some pain with flexion of IP joint as well. Strength is intact. Pulses cap refill and sensation are intact.    Lab and Radiology Results Limited musculoskeletal ultrasound of right thumb UCL.  No visible full-thickness defect of ulnar collateral ligament.  No obvious Steiner lesion. No obvious bone avulsion or injury on the ulnar side of the  thumb.    Assessment and Plan: 33 y.o. female with thumb pain following injury.  Despite ultrasound findings I am still somewhat concerned for ulnar collateral ligament injury.  I am doubtful of a Madaline GuthrieSteiner lesion so I think it is reasonable to splint or cast for a few weeks.  We will fit patient with a well-formed EXOS long thumb spica cast and recheck in about 4 weeks.  Okay to work with cast if able.  Will write work note if needed.  Return sooner if needed.   PDMP not reviewed this encounter. No orders of the defined types were placed in this encounter.  No orders of the defined types were placed in this encounter.   Historical information moved to improve visibility of documentation.  Past Medical History:  Diagnosis Date  . Anxiety 04/13/2012   No past surgical history on file. Social History   Tobacco Use  . Smoking status: Former Smoker    Years: 1.00    Last attempt to quit: 10/13/2010    Years since quitting: 7.9  . Smokeless tobacco: Never Used  Substance Use Topics  . Alcohol use: Yes   family history includes Diabetes in her father; Heart attack in her unknown relative; Hypertension in her mother; Leukemia in her unknown relative; Stroke in her unknown relative.  Medications: Current Outpatient Medications  Medication Sig Dispense Refill  . amphetamine-dextroamphetamine (ADDERALL) 20 MG tablet Take 1 tablet (20 mg total) by mouth 3 (three) times  daily. 90 tablet 0  . traMADol (ULTRAM) 50 MG tablet Take 1 tablet (50 mg total) by mouth every 8 (eight) hours as needed for severe pain. 8 tablet 0   No current facility-administered medications for this visit.    Allergies  Allergen Reactions  . Adderall Xr [Amphetamine-Dextroamphet Er]     Only XR but gives her a headache.   . Effexor Xr [Venlafaxine Hcl Er]     fatigue  . Paroxetine Hcl Other (See Comments)    Headaches, insomnia, nausea  . Wellbutrin [Bupropion]     Unease      Discussed warning signs or  symptoms. Please see discharge instructions. Patient expresses understanding.

## 2018-09-21 NOTE — Patient Instructions (Signed)
Thank you for coming in today. Use the cast/splint for 4 weeks.  Recheck in 4 weeks or sooner if needed . OK to remove the cast to bathe and clean the cast.  Ok to wear sleeve under the cast.    Skier's Thumb  Skier's thumb is a stretched or torn ligament in the thumb from a sudden injury (acute injury). It is sometimes called gamekeeper's thumb if it developed gradually (chronic injury) from repeated overstretching of the ligaments. Ligaments are strong bands of tissue that connect bones. The ligament that is injured (ulnar collateral ligament) connects the bones that make up the joint at the base of the thumb. A tear can be either partial or complete. The severity of the injury depends on how much of the ligament was damaged or torn. If it is not treated properly, this injury can lead to arthritis. What are the causes? This condition occurs when the thumb is forcefully moved past its normal range of motion toward the wrist. It may be caused by:  Falling onto an outstretched hand. This often happens to skiers who fall with ski poles in their hands.  Repeated movements that use the thumb, like catching a ball or other object. What increases the risk? You are more likely to develop this condition if:  You had a previous thumb injury.  You play contact sports or sports that involve catching balls, such as baseball, basketball, or football.  You do activities that increase the chance that the thumb will be pulled away from the rest of the hand.  You have poor hand strength and flexibility.  You do not warm up properly before activities. What are the signs or symptoms? Symptoms of this condition include:  Pain or tenderness.  Swelling.  Trouble grasping or pinching with the injured thumb.  Bruising or redness. If the injury is severe, a lump (mass) may be felt under the skin in the injured area. How is this diagnosed? This condition may be diagnosed based on:  Your symptoms and  medical history.  A physical exam.  Imaging tests, such as X-rays, an ultrasound, or an MRI. How is this treated? Treatment for this condition depends on the severity of your injury.  If the ligament is overstretched or partially torn, treatment usually involves keeping your thumb in a fixed position (immobilization) for a period of time. Your health care provider will apply a brace, cast, or splint to keep your thumb from moving until it heals.  If the ligament is fully torn, you may need surgery to reconnect the ligament to the bone. After surgery, you will need to wear a cast or splint on your thumb. Your health care provider may also suggest exercises or physical therapy to strengthen your thumb. Follow these instructions at home: If you have a cast:  Do not stick anything inside the cast to scratch your skin. Doing that increases your risk of infection.  Check the skin around the cast every day. Tell your health care provider about any concerns.  You may put lotion on dry skin around the edges of the cast. Do not put lotion on the skin underneath the cast.  Keep the cast clean and dry. If you have a splint or brace:   Wear the splint or brace as told by your health care provider. Remove it only as told by your health care provider.  Loosen the splint or brace if your fingers tingle, become numb, or turn cold and blue.  Keep the  splint or brace clean and dry. Bathing  Do not take baths, swim, or use a hot tub until your health care provider approves. Ask your health care provider if you may take showers. You may only be allowed to take sponge baths.  If your cast, splint, or brace is not waterproof: ? Do not let it get wet. ? Cover it with a watertight covering when you take a bath or shower. Managing pain, stiffness, and swelling   If directed, put ice on the injured area: ? If you have a removable splint or brace, remove it as told by your health care provider. ? Put  ice in a plastic bag. ? Place a towel between your skin and the bag or between your cast and the bag. ? Leave the ice on for 20 minutes, 2-3 times a day.  Move your fingers often to avoid stiffness and to lessen swelling.  Raise (elevate) the injured area above the level of your heart while you are sitting or lying down. Driving  Do not drive or use heavy machinery while taking prescription pain medicine.  Ask your health care provider when it is safe to drive if you have a cast, splint, or brace on your hand. Activity  Return to your normal activities as told by your health care provider. Ask your health care provider what activities are safe for you.  Do exercises as told by your health care provider or physical therapist. General instructions  Do not put pressure on any part of the cast or splint until it is fully hardened. This may take several hours.  Do not wear rings on your injured thumb.  Take over-the-counter and prescription medicines only as told by your health care provider.  To prevent or treat constipation while you are taking prescription pain medicine, your health care provider may recommend that you: ? Drink enough fluid to keep your urine pale yellow. ? Eat foods that are high in fiber, such as beans, whole grains, and fresh fruits and vegetables. ? Limit foods that are high in fat and processed sugars, such as fried or sweet foods. ? Take an over-the-counter or prescription medicine for constipation.  Keep all follow-up visits as told by your health care provider. This is important. Contact a health care provider if:  Your pain is not controlled with medicine.  Your bruising or swelling gets worse.  Your cast or splint is damaged.  Your thumb is numb and feels colder to the touch than normal. Get help right away if:  You have severe pain.  Your thumb is pale or blue. Summary  Skier's thumb is a stretched or torn ligament in the thumb.  This injury  can happen suddenly (acute) or may develop gradually (chronic).  Treatment usually involves wearing a cast, splint, or brace on your thumb. Surgery may be needed if the ligament is fully torn. This information is not intended to replace advice given to you by your health care provider. Make sure you discuss any questions you have with your health care provider. Document Released: 04/13/2005 Document Revised: 08/04/2017 Document Reviewed: 08/04/2017 Elsevier Interactive Patient Education  2019 [employer]

## 2018-09-22 ENCOUNTER — Encounter: Payer: Self-pay | Admitting: Family Medicine

## 2018-09-23 ENCOUNTER — Encounter: Payer: Self-pay | Admitting: Family Medicine

## 2018-10-19 ENCOUNTER — Ambulatory Visit: Payer: BLUE CROSS/BLUE SHIELD | Admitting: Family Medicine

## 2018-11-07 ENCOUNTER — Encounter: Payer: Self-pay | Admitting: Physician Assistant

## 2018-11-08 MED ORDER — METRONIDAZOLE 0.75 % VA GEL: 1.0000 | Freq: Two times a day (BID) | VAGINAL | 0 refills | Status: DC

## 2018-11-30 ENCOUNTER — Telehealth (INDEPENDENT_AMBULATORY_CARE_PROVIDER_SITE_OTHER): Payer: BC Managed Care – PPO | Admitting: Physician Assistant

## 2018-11-30 ENCOUNTER — Encounter: Payer: Self-pay | Admitting: Physician Assistant

## 2018-11-30 VITALS — BP 122/84 | Temp 97.8°F

## 2018-11-30 DIAGNOSIS — F909 Attention-deficit hyperactivity disorder, unspecified type: Secondary | ICD-10-CM

## 2018-11-30 MED ORDER — AMPHETAMINE-DEXTROAMPHETAMINE 20 MG PO TABS: 20.0000 mg | ORAL_TABLET | Freq: Three times a day (TID) | ORAL | 0 refills | Status: DC

## 2018-11-30 NOTE — Progress Notes (Signed)
..  Virtual Visit via Video Note  I connected with Melanie Fritz on 11/30/18 at  8:50 AM EDT by a video enabled telemedicine application and verified that I am speaking with the correct person using two identifiers.  Location: Patient: home Provider: clinic   I discussed the limitations of evaluation and management by telemedicine and the availability of in person appointments. The patient expressed understanding and agreed to proceed.  History of Present Illness: Pt is a 33 yo female with ADHD who calls into the clinic for medication refills. Pt is doing great. She does like the 20mg  tid best. No problems at work. She is sleeping well. She denies any anxiety, palpitations, headaches. No mood changes.   .. Active Ambulatory Problems    Diagnosis Date Noted  . Anxiety 04/13/2012  . History of Reoccuring BV 05/03/2012  . Bacterial vaginosis 05/18/2012  . Depression 06/06/2012  . Sprain of ankle, right 09/05/2012  . Adult ADHD 07/13/2014  . Left breast mass 05/09/2015  . Muscle spasm 02/23/2017  . Trouble in sleeping 05/03/2018  . Muscle twitching 05/03/2018   Resolved Ambulatory Problems    Diagnosis Date Noted  . No Resolved Ambulatory Problems   No Additional Past Medical History   Reviewed med, problem, allergy list.     Observations/Objective: No acute distress.  Normal breathing.  Normal mood.   .. Today's Vitals   11/30/18 0901  BP: 122/84  Temp: 97.8 F (36.6 C)  TempSrc: Oral   There is no height or weight on file to calculate BMI.    Assessment and Plan: Marland KitchenMarland KitchenSudie was seen today for adhd.  Diagnoses and all orders for this visit:  Adult ADHD -     amphetamine-dextroamphetamine (ADDERALL) 20 MG tablet; Take 1 tablet (20 mg total) by mouth 3 (three) times daily. -     amphetamine-dextroamphetamine (ADDERALL) 20 MG tablet; Take 1 tablet (20 mg total) by mouth 3 (three) times daily. -     amphetamine-dextroamphetamine (ADDERALL) 20 MG tablet; Take 1 tablet  (20 mg total) by mouth 3 (three) times daily.   Refilled for 3 months.   Follow Up Instructions:    I discussed the assessment and treatment plan with the patient. The patient was provided an opportunity to ask questions and all were answered. The patient agreed with the plan and demonstrated an understanding of the instructions.   The patient was advised to call back or seek an in-person evaluation if the symptoms worsen or if the condition fails to improve as anticipated.     Iran Planas, PA-C

## 2018-12-27 ENCOUNTER — Encounter: Payer: Self-pay | Admitting: Physician Assistant

## 2018-12-27 DIAGNOSIS — F909 Attention-deficit hyperactivity disorder, unspecified type: Secondary | ICD-10-CM

## 2018-12-27 MED ORDER — AMPHETAMINE-DEXTROAMPHETAMINE 20 MG PO TABS: 20.0000 mg | ORAL_TABLET | Freq: Three times a day (TID) | ORAL | 0 refills | Status: DC

## 2018-12-27 NOTE — Telephone Encounter (Signed)
Thanks

## 2018-12-27 NOTE — Telephone Encounter (Signed)
See patient's attachment.   I called [facility] and cancelled Adderall for September and October and I have pended RXs for [facility] to be filled 9/5 and 10/5. Please sign and send

## 2018-12-28 NOTE — Telephone Encounter (Signed)
Will you ok early refill.

## 2018-12-28 NOTE — Telephone Encounter (Signed)
Called [facility] and okayed early refill for 12/30/2018.

## 2019-01-20 DIAGNOSIS — Z20828 Contact with and (suspected) exposure to other viral communicable diseases: Secondary | ICD-10-CM | POA: Diagnosis not present

## 2019-01-20 DIAGNOSIS — R110 Nausea: Secondary | ICD-10-CM | POA: Diagnosis not present

## 2019-02-15 ENCOUNTER — Telehealth: Payer: Self-pay | Admitting: Physician Assistant

## 2019-02-15 DIAGNOSIS — Z1329 Encounter for screening for other suspected endocrine disorder: Secondary | ICD-10-CM

## 2019-02-15 DIAGNOSIS — Z13220 Encounter for screening for lipoid disorders: Secondary | ICD-10-CM

## 2019-02-15 DIAGNOSIS — Z131 Encounter for screening for diabetes mellitus: Secondary | ICD-10-CM

## 2019-02-15 NOTE — Telephone Encounter (Signed)
Pt sent msg via mychart for an appointment for follow up on meds.  Her appointment is November 2nd. She also wants blood work done to check her levels of everything.  Thanks.

## 2019-02-16 NOTE — Telephone Encounter (Signed)
Sent orderes.

## 2019-02-16 NOTE — Telephone Encounter (Signed)
Mychart message sent to patient making her aware.

## 2019-02-27 ENCOUNTER — Ambulatory Visit (INDEPENDENT_AMBULATORY_CARE_PROVIDER_SITE_OTHER): Payer: BC Managed Care – PPO | Admitting: Physician Assistant

## 2019-02-27 ENCOUNTER — Other Ambulatory Visit: Payer: Self-pay

## 2019-02-27 ENCOUNTER — Encounter: Payer: Self-pay | Admitting: Physician Assistant

## 2019-02-27 VITALS — BP 132/88 | HR 102 | Ht 63.0 in | Wt 157.0 lb

## 2019-02-27 DIAGNOSIS — F419 Anxiety disorder, unspecified: Secondary | ICD-10-CM | POA: Diagnosis not present

## 2019-02-27 DIAGNOSIS — Z13220 Encounter for screening for lipoid disorders: Secondary | ICD-10-CM | POA: Diagnosis not present

## 2019-02-27 DIAGNOSIS — Z1329 Encounter for screening for other suspected endocrine disorder: Secondary | ICD-10-CM | POA: Diagnosis not present

## 2019-02-27 DIAGNOSIS — R635 Abnormal weight gain: Secondary | ICD-10-CM | POA: Diagnosis not present

## 2019-02-27 DIAGNOSIS — F341 Dysthymic disorder: Secondary | ICD-10-CM

## 2019-02-27 DIAGNOSIS — F909 Attention-deficit hyperactivity disorder, unspecified type: Secondary | ICD-10-CM

## 2019-02-27 DIAGNOSIS — Z131 Encounter for screening for diabetes mellitus: Secondary | ICD-10-CM | POA: Diagnosis not present

## 2019-02-27 DIAGNOSIS — E663 Overweight: Secondary | ICD-10-CM

## 2019-02-27 MED ORDER — AMPHETAMINE-DEXTROAMPHETAMINE 20 MG PO TABS: 20.0000 mg | ORAL_TABLET | Freq: Three times a day (TID) | ORAL | 0 refills | Status: DC

## 2019-02-27 NOTE — Progress Notes (Signed)
Subjective:    Patient ID: Melanie Fritz, female    DOB: 1985/09/07, 33 y.o.   MRN: [id number]  HPI  Pt is a 33 yo female with ADHD, MDD, anxiety who presents to the clinic for medication refills.   Pt is doing well. Her job is going great. No problems or concerns. She is not on anything for anxiety and depression. She is doing well with meditation and tanning. She does not like her weight gain. She wants to lose weight.   .. Active Ambulatory Problems    Diagnosis Date Noted  . Anxiety 04/13/2012  . History of Reoccuring BV 05/03/2012  . Bacterial vaginosis 05/18/2012  . Depression 06/06/2012  . Sprain of ankle, right 09/05/2012  . Adult ADHD 07/13/2014  . Left breast mass 05/09/2015  . Muscle spasm 02/23/2017  . Trouble in sleeping 05/03/2018  . Muscle twitching 05/03/2018  . Weight gain 02/27/2019  . Overweight 02/27/2019   Resolved Ambulatory Problems    Diagnosis Date Noted  . No Resolved Ambulatory Problems   No Additional Past Medical History       Review of Systems  All other systems reviewed and are negative.      Objective:   Physical Exam Vitals signs reviewed.  Constitutional:      Appearance: Normal appearance.  HENT:     Head: Normocephalic.  Cardiovascular:     Rate and Rhythm: Normal rate and regular rhythm.     Pulses: Normal pulses.  Pulmonary:     Effort: Pulmonary effort is normal.     Breath sounds: Normal breath sounds.  Neurological:     General: No focal deficit present.     Mental Status: She is alert and oriented to person, place, and time.  Psychiatric:        Mood and Affect: Mood normal.          .. Depression screen [facility] 2/9 02/27/2019 07/25/2018 05/03/2018 11/01/2017 06/16/2017  Decreased Interest 0 0 0 0 0  Down, Depressed, Hopeless 0 0 0 0 2  PHQ - 2 Score 0 0 0 0 2  Altered sleeping 0 1 1 0 2  Tired, decreased energy 1 0 1 0 2  Change in appetite 1 0 0 0 0  Feeling bad or failure about yourself  0 0 0 0 0  Trouble  concentrating 0 0 0 0 0  Moving slowly or fidgety/restless 0 0 0 0 0  Suicidal thoughts 0 0 0 0 0  PHQ-9 Score 2 1 2  0 6  Difficult doing work/chores Not difficult at all Somewhat difficult Not difficult at all - Not difficult at all   .Marland Kitchen GAD 7 : Generalized Anxiety Score 02/27/2019 07/25/2018 05/03/2018 11/01/2017  Nervous, Anxious, on Edge 0 0 0 0  Control/stop worrying 1 0 0 0  Worry too much - different things 0 0 0 0  Trouble relaxing 0 0 0 0  Restless 0 0 0 0  Easily annoyed or irritable 0 0 0 0  Afraid - awful might happen 0 0 0 0  Total GAD 7 Score 1 0 0 0  Anxiety Difficulty Not difficult at all Not difficult at all Not difficult at all -     Assessment & Plan:  Marland KitchenMarland KitchenEriyonna was seen today for adhd.  Diagnoses and all orders for this visit:  Adult ADHD -     amphetamine-dextroamphetamine (ADDERALL) 20 MG tablet; Take 1 tablet (20 mg total) by mouth 3 (three) times daily. -  amphetamine-dextroamphetamine (ADDERALL) 20 MG tablet; Take 1 tablet (20 mg total) by mouth 3 (three) times daily. -     amphetamine-dextroamphetamine (ADDERALL) 20 MG tablet; Take 1 tablet (20 mg total) by mouth 3 (three) times daily.  Anxiety  Dysthymia  Weight gain  Overweight   PHQ and GAD looked good.  Continue with conservative measures.   Refilled adderall for 3 months.   Discussed weight.  Encouraged IF 16:8.  Encouraged 150 minutes of exercise a week.  Encouraged 1500 calorie diet.   Follow up in 3 months.

## 2019-02-28 LAB — COMPLETE METABOLIC PANEL WITH GFR
AG Ratio: 1.6 (calc) (ref 1.0–2.5)
ALT: 18 U/L (ref 6–29)
AST: 22 U/L (ref 10–30)
Albumin: 4.5 g/dL (ref 3.6–5.1)
Alkaline phosphatase (APISO): 71 U/L (ref 31–125)
BUN: 17 mg/dL (ref 7–25)
CO2: 25 mmol/L (ref 20–32)
Calcium: 10.1 mg/dL (ref 8.6–10.2)
Chloride: 101 mmol/L (ref 98–110)
Creat: 0.76 mg/dL (ref 0.50–1.10)
GFR, Est African American: 120 mL/min/{1.73_m2} (ref >=60)
GFR, Est Non African American: 104 mL/min/{1.73_m2} (ref >=60)
Globulin: 2.9 g/dL (calc) (ref 1.9–3.7)
Glucose, Bld: 82 mg/dL (ref 65–99)
Potassium: 4.2 mmol/L (ref 3.5–5.3)
Sodium: 138 mmol/L (ref 135–146)
Total Bilirubin: 0.4 mg/dL (ref 0.2–1.2)
Total Protein: 7.4 g/dL (ref 6.1–8.1)

## 2019-02-28 LAB — LIPID PANEL W/REFLEX DIRECT LDL
Cholesterol: 204 mg/dL — ABNORMAL HIGH (ref <200)
HDL: 116 mg/dL (ref >=50)
LDL Cholesterol (Calc): 72 mg/dL (calc)
Non-HDL Cholesterol (Calc): 88 mg/dL (calc) (ref <130)
Total CHOL/HDL Ratio: 1.8 (calc) (ref <5.0)
Triglycerides: 75 mg/dL (ref <150)

## 2019-02-28 LAB — TSH: TSH: 1.63 mIU/L

## 2019-02-28 NOTE — Telephone Encounter (Signed)
[name],   Cholesterol is fabulous!!! HDL is great. What are you doing?   Thyroid is perfect.   Kidney, liver, glucose look great.   [name]

## 2019-05-08 DIAGNOSIS — U071 COVID-19: Secondary | ICD-10-CM | POA: Diagnosis not present

## 2019-05-08 DIAGNOSIS — R05 Cough: Secondary | ICD-10-CM | POA: Diagnosis not present

## 2019-05-08 DIAGNOSIS — R0981 Nasal congestion: Secondary | ICD-10-CM | POA: Diagnosis not present

## 2019-05-08 DIAGNOSIS — R0982 Postnasal drip: Secondary | ICD-10-CM | POA: Diagnosis not present

## 2019-05-30 ENCOUNTER — Encounter: Payer: Self-pay | Admitting: Physician Assistant

## 2019-05-30 ENCOUNTER — Telehealth (INDEPENDENT_AMBULATORY_CARE_PROVIDER_SITE_OTHER): Payer: BC Managed Care – PPO | Admitting: Physician Assistant

## 2019-05-30 VITALS — BP 112/78 | HR 88

## 2019-05-30 DIAGNOSIS — F909 Attention-deficit hyperactivity disorder, unspecified type: Secondary | ICD-10-CM

## 2019-05-30 DIAGNOSIS — B001 Herpesviral vesicular dermatitis: Secondary | ICD-10-CM | POA: Diagnosis not present

## 2019-05-30 MED ORDER — AMPHETAMINE-DEXTROAMPHETAMINE 20 MG PO TABS: 20.0000 mg | ORAL_TABLET | Freq: Three times a day (TID) | ORAL | 0 refills | Status: DC

## 2019-05-30 MED ORDER — VALACYCLOVIR HCL 1 G PO TABS: 1000.0000 mg | ORAL_TABLET | Freq: Two times a day (BID) | ORAL | 0 refills | Status: DC

## 2019-05-30 MED ORDER — ACYCLOVIR 5 % EX OINT: 1.0000 "application " | TOPICAL_OINTMENT | CUTANEOUS | 0 refills | Status: DC

## 2019-05-30 NOTE — Progress Notes (Signed)
Patient ID: Melanie Fritz, female   DOB: [birth year], 34 y.o.   MRN: [id number] .Marland KitchenVirtual Visit via Video Note  I connected with Melanie Fritz on 05/30/19 at  8:30 AM EST by a video enabled telemedicine application and verified that I am speaking with the correct person using two identifiers.  Location: Patient: work Provider: clinic   I discussed the limitations of evaluation and management by telemedicine and the availability of in person appointments. The patient expressed understanding and agreed to proceed.  History of Present Illness: Pt is a 34 yo female with ADHD, anxiety, depression who calls in today for 3 month refills. No problems or concerns. She is doing well on current dose. She denies any increase in anxiety or depression. Her mood is really good right now. Pt is sleeping well.   She does note recurrent cold sores. She is getting them more often and would like something to treat.   .. Active Ambulatory Problems    Diagnosis Date Noted  . Anxiety 04/13/2012  . History of Reoccuring BV 05/03/2012  . Bacterial vaginosis 05/18/2012  . Depression 06/06/2012  . Sprain of ankle, right 09/05/2012  . Adult ADHD 07/13/2014  . Left breast mass 05/09/2015  . Muscle spasm 02/23/2017  . Trouble in sleeping 05/03/2018  . Muscle twitching 05/03/2018  . Weight gain 02/27/2019  . Overweight 02/27/2019  . Recurrent cold sores 05/30/2019   Resolved Ambulatory Problems    Diagnosis Date Noted  . No Resolved Ambulatory Problems   No Additional Past Medical History   Reviewed med, allergy, problem list.     Observations/Objective: No acute distress.  Normal mood and appearance.  Herpetic lesion of right lower lip.   .. Today's Vitals   05/30/19 0908  BP: 112/78  Pulse: 88   There is no height or weight on file to calculate BMI.  .. Depression screen [facility] 2/9 02/27/2019 07/25/2018 05/03/2018 11/01/2017 06/16/2017  Decreased Interest 0 0 0 0 0  Down, Depressed, Hopeless 0 0 0 0 2   PHQ - 2 Score 0 0 0 0 2  Altered sleeping 0 1 1 0 2  Tired, decreased energy 1 0 1 0 2  Change in appetite 1 0 0 0 0  Feeling bad or failure about yourself  0 0 0 0 0  Trouble concentrating 0 0 0 0 0  Moving slowly or fidgety/restless 0 0 0 0 0  Suicidal thoughts 0 0 0 0 0  PHQ-9 Score 2 1 2  0 6  Difficult doing work/chores Not difficult at all Somewhat difficult Not difficult at all - Not difficult at all   . GAD 7 : Generalized Anxiety Score 02/27/2019 07/25/2018 05/03/2018 11/01/2017  Nervous, Anxious, on Edge 0 0 0 0  Control/stop worrying 1 0 0 0  Worry too much - different things 0 0 0 0  Trouble relaxing 0 0 0 0  Restless 0 0 0 0  Easily annoyed or irritable 0 0 0 0  Afraid - awful might happen 0 0 0 0  Total GAD 7 Score 1 0 0 0  Anxiety Difficulty Not difficult at all Not difficult at all Not difficult at all -       Assessment and Plan: 9/8/2019Marland KitchenNaeemah was seen today for adhd.  Diagnoses and all orders for this visit:  Adult ADHD -     amphetamine-dextroamphetamine (ADDERALL) 20 MG tablet; Take 1 tablet (20 mg total) by mouth 3 (three) times daily. -     amphetamine-dextroamphetamine (  ADDERALL) 20 MG tablet; Take 1 tablet (20 mg total) by mouth 3 (three) times daily. -     amphetamine-dextroamphetamine (ADDERALL) 20 MG tablet; Take 1 tablet (20 mg total) by mouth 3 (three) times daily.  Recurrent cold sores -     valACYclovir (VALTREX) 1000 MG tablet; Take 1 tablet (1,000 mg total) by mouth 2 (two) times daily. For one day for outbreaks. -     acyclovir ointment (ZOVIRAX) 5 %; Apply 1 application topically every 3 (three) hours. As needed for cold sores.   Refilled adderall for 3 months.  Sent valtrex and/or zovirax cream for outbreaks. Discussed causes and ways to prevent.    Follow Up Instructions:    I discussed the assessment and treatment plan with the patient. The patient was provided an opportunity to ask questions and all were answered. The patient agreed with  the plan and demonstrated an understanding of the instructions.   The patient was advised to call back or seek an in-person evaluation if the symptoms worsen or if the condition fails to improve as anticipated.    Iran Planas, PA-C

## 2019-06-05 IMAGING — DX RIGHT HAND - COMPLETE 3+ VIEW
3 series · 3 of 3 positions shown · non-contrast
Comparison: None.

CLINICAL DATA: Right thumb pain

EXAM:
RIGHT HAND - COMPLETE 3+ VIEW

[hand pa]
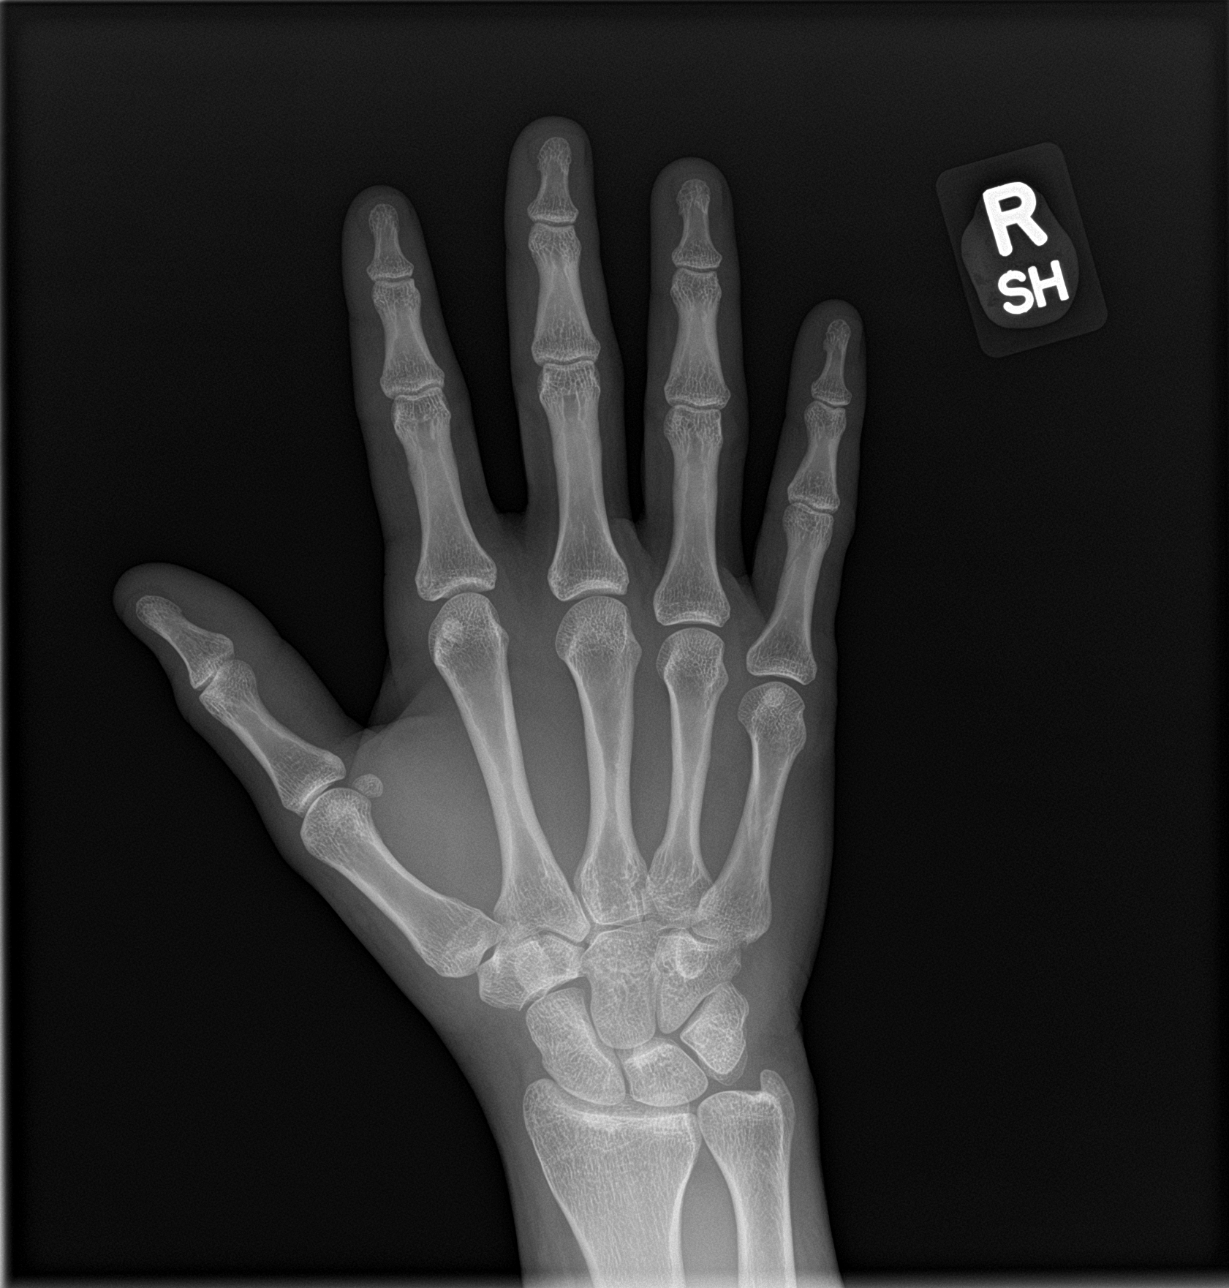

[hand obl]
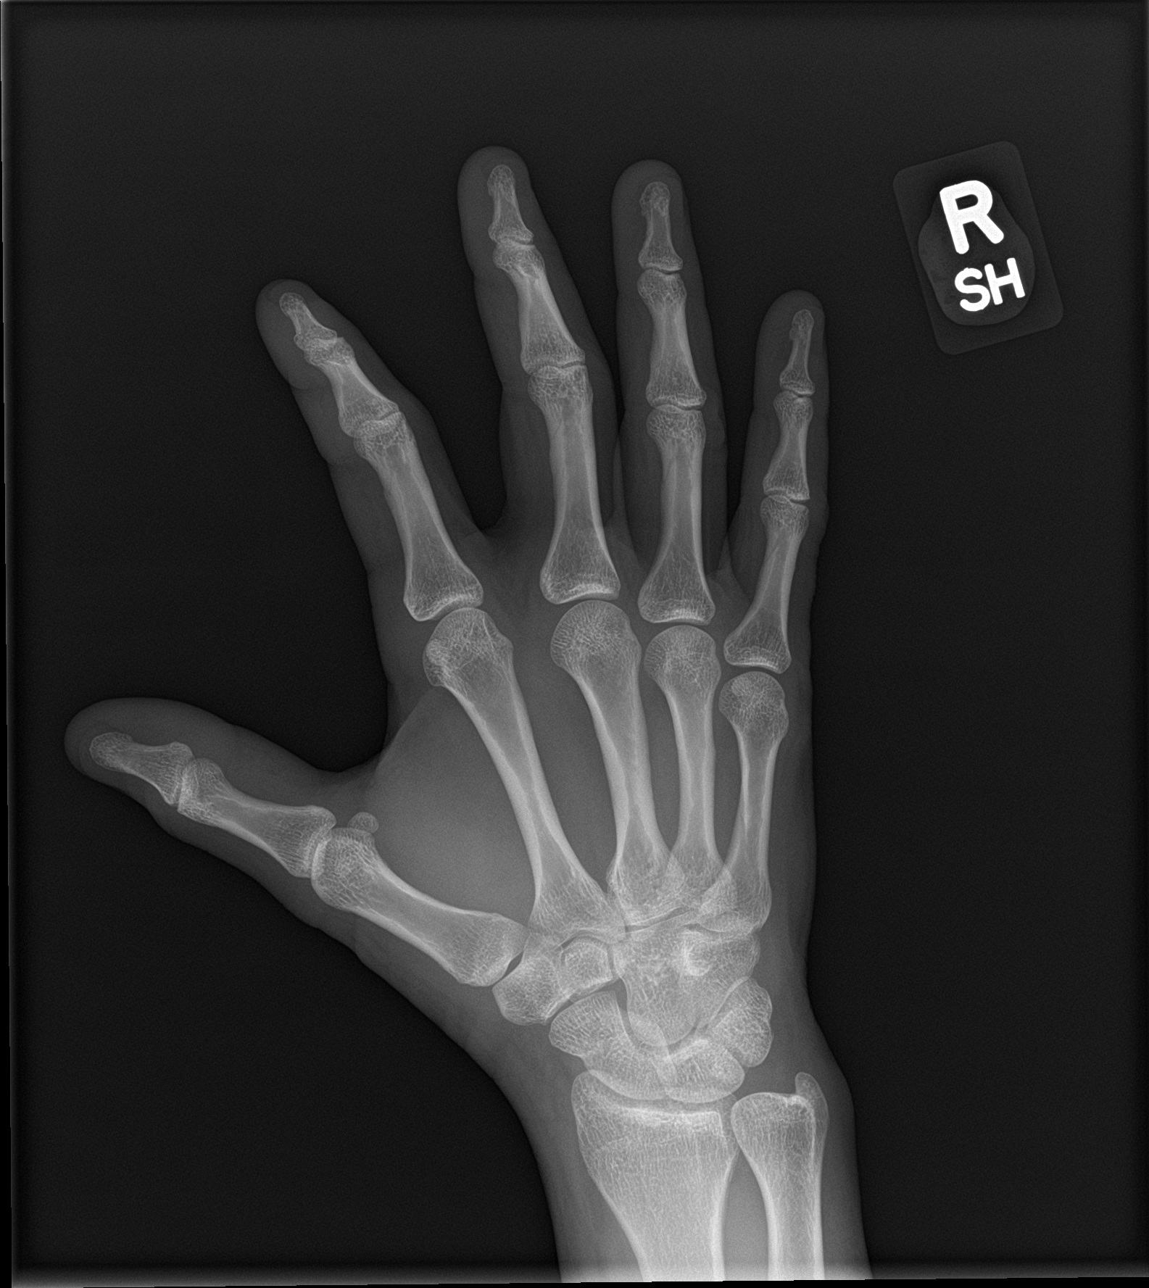

[hand lat]
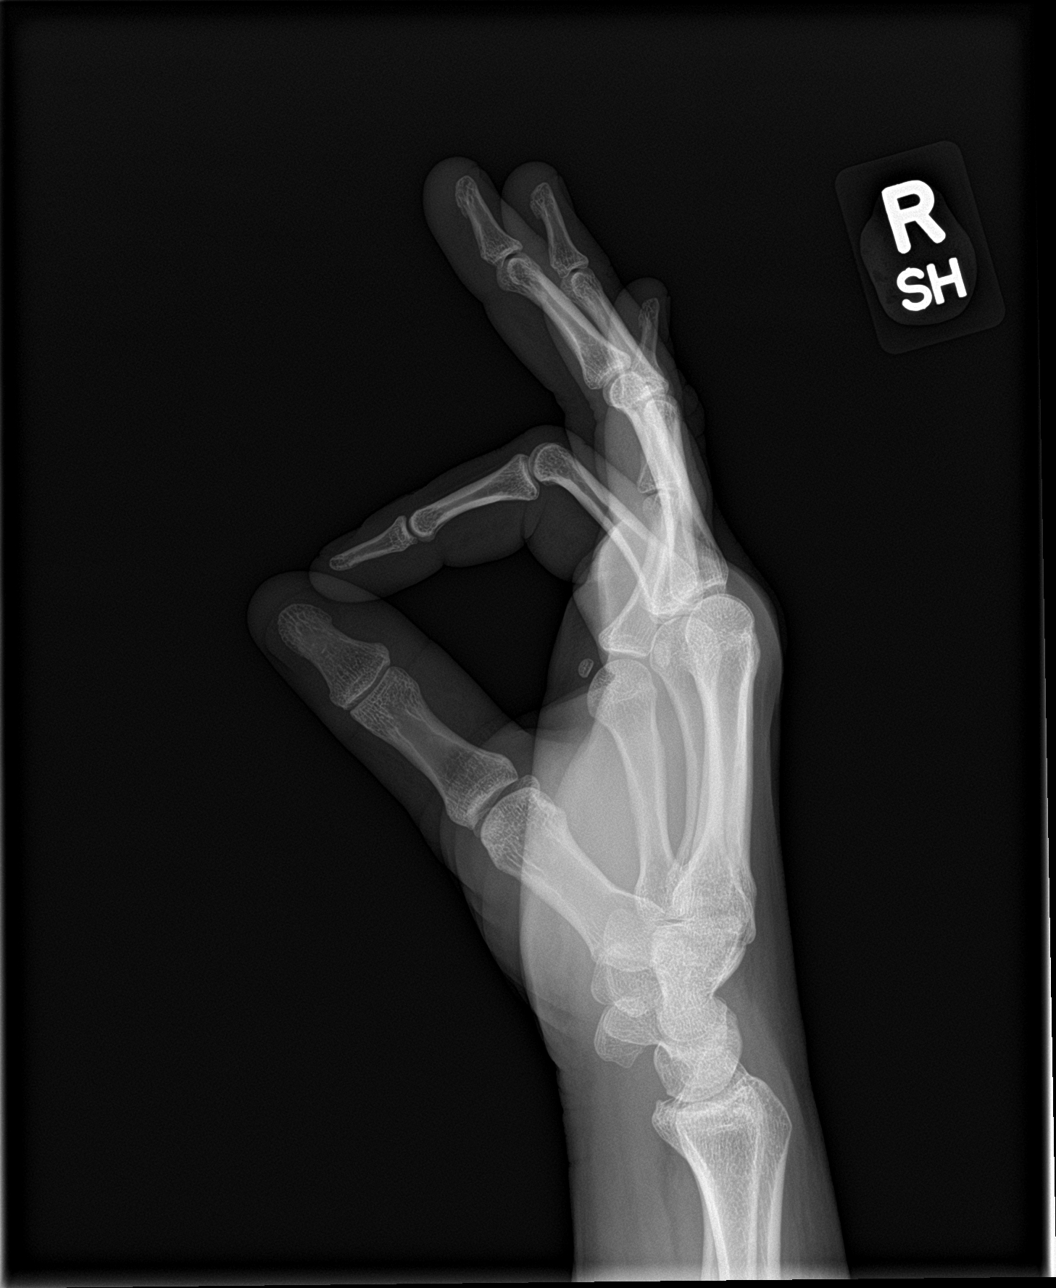

[3 of 3 positions shown; findings below may reference images not displayed]

FINDINGS: There is no evidence of fracture or dislocation. There is no
evidence of arthropathy or other focal bone abnormality. Soft
tissues are unremarkable.
IMPRESSION: Negative.

## 2019-08-15 ENCOUNTER — Encounter: Payer: Self-pay | Admitting: Physician Assistant

## 2019-08-29 ENCOUNTER — Other Ambulatory Visit: Payer: Self-pay

## 2019-08-29 ENCOUNTER — Ambulatory Visit (INDEPENDENT_AMBULATORY_CARE_PROVIDER_SITE_OTHER): Payer: BC Managed Care – PPO | Admitting: Nurse Practitioner

## 2019-08-29 ENCOUNTER — Encounter: Payer: Self-pay | Admitting: Nurse Practitioner

## 2019-08-29 DIAGNOSIS — F909 Attention-deficit hyperactivity disorder, unspecified type: Secondary | ICD-10-CM

## 2019-08-29 MED ORDER — AMPHETAMINE-DEXTROAMPHETAMINE 20 MG PO TABS: 20.0000 mg | ORAL_TABLET | Freq: Three times a day (TID) | ORAL | 0 refills | Status: DC

## 2019-08-29 NOTE — Progress Notes (Signed)
Established Patient Office Visit  Subjective:  Patient ID: Melanie Fritz, female    DOB: 1986-02-04  Age: 34 y.o. MRN: [id number]  CC:  Chief Complaint  Patient presents with  . ADHD    HPI Melanie Fritz presents for follow-up for ADHD management and refills on medication. She is well controlled on her current medication and dose and denies any adverse side effects from the medication. She is sleeping well. She is able to function appropriately with the medication. She denies any side effects or withdrawal symptoms when she does not take the medication on the weekends.   ADHD FOLLOW UP ADHD status: controlled Satisfied with current therapy: yes Medication compliance:  excellent compliance Taking meds on weekends/vacations: no Work/school performance:  excellent Difficulty sustaining attention/completing tasks: no Distracted by extraneous stimuli: no Does not listen when spoken to: no  Fidgets with hands or feet: no Unable to stay in seat: no Blurts out/interrupts others: no ADHD Medication Side Effects: no    Decreased appetite: no    Headache: no    Sleeping disturbance pattern: no    Irritability: no    Rebound effects (worse than baseline) off medication: no    Anxiousness: no    Dizziness: no    Tics: no   Past Medical History:  Diagnosis Date  . Anxiety 04/13/2012    No past surgical history on file.  Family History  Problem Relation Age of Onset  . Diabetes Father   . Leukemia Unknown   . Heart attack Unknown        grandfather  . Hypertension Mother   . Stroke Unknown        grandfather    Social History   Socioeconomic History  . Marital status: Single    Spouse name: Not on file  . Number of children: Not on file  . Years of education: Not on file  . Highest education level: Not on file  Occupational History  . Not on file  Tobacco Use  . Smoking status: Former Smoker    Years: 1.00    Quit date: 10/13/2010    Years since quitting: 8.8  .  Smokeless tobacco: Never Used  Substance and Sexual Activity  . Alcohol use: Yes  . Drug use: No  . Sexual activity: Yes  Other Topics Concern  . Not on file  Social History Narrative  . Not on file   Social Determinants of Health   Financial Resource Strain:   . Difficulty of Paying Living Expenses:   Food Insecurity:   . Worried About Charity fundraiser in the Last Year:   . Arboriculturist in the Last Year:   Transportation Needs:   . Film/video editor (Medical):   Marland Kitchen Lack of Transportation (Non-Medical):   Physical Activity:   . Days of Exercise per Week:   . Minutes of Exercise per Session:   Stress:   . Feeling of Stress :   Social Connections:   . Frequency of Communication with Friends and Family:   . Frequency of Social Gatherings with Friends and Family:   . Attends Religious Services:   . Active Member of Clubs or Organizations:   . Attends Archivist Meetings:   Marland Kitchen Marital Status:   Intimate Partner Violence:   . Fear of Current or Ex-Partner:   . Emotionally Abused:   Marland Kitchen Physically Abused:   . Sexually Abused:     Outpatient Medications Prior to Visit  Medication Sig  Dispense Refill  . acyclovir ointment (ZOVIRAX) 5 % Apply 1 application topically every 3 (three) hours. As needed for cold sores. 15 g 0  . amphetamine-dextroamphetamine (ADDERALL) 20 MG tablet Take 1 tablet (20 mg total) by mouth 3 (three) times daily. 90 tablet 0  . amphetamine-dextroamphetamine (ADDERALL) 20 MG tablet Take 1 tablet (20 mg total) by mouth 3 (three) times daily. 90 tablet 0  . amphetamine-dextroamphetamine (ADDERALL) 20 MG tablet Take 1 tablet (20 mg total) by mouth 3 (three) times daily. 90 tablet 0  . valACYclovir (VALTREX) 1000 MG tablet Take 1 tablet (1,000 mg total) by mouth 2 (two) times daily. For one day for outbreaks. 30 tablet 0   No facility-administered medications prior to visit.    Allergies  Allergen Reactions  . Adderall Xr  [Amphetamine-Dextroamphet Er]     Only XR but gives her a headache.   . Effexor Xr [Venlafaxine Hcl Er]     fatigue  . Paroxetine Hcl Other (See Comments)    Headaches, insomnia, nausea  . Wellbutrin [Bupropion]     Unease    ROS Review of Systems  Constitutional: Negative for activity change, appetite change, chills, fatigue and unexpected weight change.  Respiratory: Negative for cough, chest tightness and shortness of breath.   Cardiovascular: Negative for chest pain, palpitations and leg swelling.  Gastrointestinal: Negative for abdominal pain, diarrhea, nausea and vomiting.  Neurological: Negative for tremors, weakness, light-headedness and headaches.  Psychiatric/Behavioral: Negative for agitation, confusion, decreased concentration, dysphoric mood and sleep disturbance. The patient is not nervous/anxious.       Objective:    Physical Exam  Constitutional: She is oriented to person, place, and time. She appears well-developed and well-nourished.  HENT:  Head: Normocephalic.  Eyes: Pupils are equal, round, and reactive to light. Conjunctivae and EOM are normal.  Cardiovascular: Normal rate, regular rhythm, normal heart sounds and intact distal pulses.  Pulmonary/Chest: Effort normal. No respiratory distress. She has no wheezes.  Abdominal: Soft. Bowel sounds are normal.  Musculoskeletal:        General: Normal range of motion.     Cervical back: Normal range of motion.  Neurological: She is alert and oriented to person, place, and time. She has normal reflexes.  Skin: Skin is warm and dry.  Psychiatric: She has a normal mood and affect. Her speech is normal and behavior is normal. Judgment and thought content normal. Cognition and memory are normal.  Nursing note and vitals reviewed.   BP (!) 124/91   Pulse 70   Ht 5\' 3"  (1.6 m)   Wt 166 lb (75.3 kg)   SpO2 100%   BMI 29.41 kg/m  Wt Readings from Last 3 Encounters:  08/29/19 166 lb (75.3 kg)  02/27/19 157 lb  (71.2 kg)  09/21/18 145 lb (65.8 kg)     There are no preventive care reminders to display for this patient.  There are no preventive care reminders to display for this patient.  Lab Results  Component Value Date   TSH 1.63 02/27/2019   Lab Results  Component Value Date   WBC 5.4 08/22/2012   HGB 13.4 08/22/2012   HCT 38.2 08/22/2012   MCV 89.0 08/22/2012   PLT 252 08/22/2012   Lab Results  Component Value Date   NA 138 02/27/2019   K 4.2 02/27/2019   CO2 25 02/27/2019   GLUCOSE 82 02/27/2019   BUN 17 02/27/2019   CREATININE 0.76 02/27/2019   BILITOT 0.4 02/27/2019   ALKPHOS  49 08/05/2016   AST 22 02/27/2019   ALT 18 02/27/2019   PROT 7.4 02/27/2019   ALBUMIN 4.3 08/05/2016   CALCIUM 10.1 02/27/2019   Lab Results  Component Value Date   CHOL 204 (H) 02/27/2019   Lab Results  Component Value Date   HDL 116 02/27/2019   Lab Results  Component Value Date   LDLCALC 72 02/27/2019   Lab Results  Component Value Date   TRIG 75 02/27/2019   Lab Results  Component Value Date   CHOLHDL 1.8 02/27/2019   No results found for: HGBA1C    Assessment & Plan:   1. Adult ADHD Well controlled ADHD in adult patient. No current concerns today. Blood pressure and heart rate well controlled. PHQ-9 and GAD7 show no signs of anxiety or depression.  No changes to medication dose or administration.  Refills provided for 3 months.  - amphetamine-dextroamphetamine (ADDERALL) 20 MG tablet; Take 1 tablet (20 mg total) by mouth 3 (three) times daily.  Dispense: 90 tablet; Refill: 0 - amphetamine-dextroamphetamine (ADDERALL) 20 MG tablet; Take 1 tablet (20 mg total) by mouth 3 (three) times daily.  Dispense: 90 tablet; Refill: 0 - amphetamine-dextroamphetamine (ADDERALL) 20 MG tablet; Take 1 tablet (20 mg total) by mouth 3 (three) times daily.  Dispense: 90 tablet; Refill: 0  Return in about 3 months (around 11/29/2019) for ADHD.   Tollie Eth, NP

## 2019-08-29 NOTE — Patient Instructions (Signed)

## 2019-11-29 ENCOUNTER — Telehealth (INDEPENDENT_AMBULATORY_CARE_PROVIDER_SITE_OTHER): Payer: Self-pay | Admitting: Physician Assistant

## 2019-11-29 ENCOUNTER — Encounter: Payer: Self-pay | Admitting: Physician Assistant

## 2019-11-29 DIAGNOSIS — N760 Acute vaginitis: Secondary | ICD-10-CM

## 2019-11-29 DIAGNOSIS — F909 Attention-deficit hyperactivity disorder, unspecified type: Secondary | ICD-10-CM

## 2019-11-29 DIAGNOSIS — B9689 Other specified bacterial agents as the cause of diseases classified elsewhere: Secondary | ICD-10-CM

## 2019-11-29 MED ORDER — METRONIDAZOLE 500 MG PO TABS: 500.0000 mg | ORAL_TABLET | Freq: Two times a day (BID) | ORAL | 0 refills | Status: DC

## 2019-11-29 MED ORDER — AMPHETAMINE-DEXTROAMPHETAMINE 20 MG PO TABS: 20.0000 mg | ORAL_TABLET | Freq: Three times a day (TID) | ORAL | 0 refills | Status: DC

## 2019-11-29 NOTE — Progress Notes (Signed)
Patient ID: Melanie Fritz, female   DOB: 1985-10-05, 34 y.o.   MRN: [id number] .Marland KitchenVirtual Visit via Video Note  I connected with Melanie Fritz on 11/29/19 at  9:50 AM EDT by a video enabled telemedicine application and verified that I am speaking with the correct person using two identifiers.  Location: Patient: home Provider: clinic   I discussed the limitations of evaluation and management by telemedicine and the availability of in person appointments. The patient expressed understanding and agreed to proceed.  History of Present Illness: Pt is a 34 yo female with ADHD and hx of BV who presents to the clinic for medication refills.   Pt is doing great with no concerns. She denies any insomnia, anxiety, palpitations, or headaches. She is doing well at work.   She does have ongoing BV. She uses boric acid bid but just recently got of period and vaginal discharge odor not improving. Wants metronidazole. No abdominal pain.no fever or chills.    .. Active Ambulatory Problems    Diagnosis Date Noted  . Anxiety 04/13/2012  . History of Reoccuring BV 05/03/2012  . Bacterial vaginosis 05/18/2012  . Depression 06/06/2012  . Sprain of ankle, right 09/05/2012  . Adult ADHD 07/13/2014  . Left breast mass 05/09/2015  . Muscle spasm 02/23/2017  . Trouble in sleeping 05/03/2018  . Muscle twitching 05/03/2018  . Weight gain 02/27/2019  . Overweight 02/27/2019  . Recurrent cold sores 05/30/2019   Resolved Ambulatory Problems    Diagnosis Date Noted  . No Resolved Ambulatory Problems   No Additional Past Medical History        Observations/Objective: No acute distress Normal mood.  .. Today's Vitals   There is no height or weight on file to calculate BMI.    Assessment and Plan: Marland KitchenMarland KitchenJeneen was seen today for add.  Diagnoses and all orders for this visit:  Adult ADHD -     amphetamine-dextroamphetamine (ADDERALL) 20 MG tablet; Take 1 tablet (20 mg total) by mouth 3 (three) times  daily. -     amphetamine-dextroamphetamine (ADDERALL) 20 MG tablet; Take 1 tablet (20 mg total) by mouth 3 (three) times daily. -     amphetamine-dextroamphetamine (ADDERALL) 20 MG tablet; Take 1 tablet (20 mg total) by mouth 3 (three) times daily.  Bacterial vaginosis -     metroNIDAZOLE (FLAGYL) 500 MG tablet; Take 1 tablet (500 mg total) by mouth 2 (two) times daily.   Pt is doing great with adderall. No concerns. Refilled for 3 months.   Hx of BV. Finished period. Has a flare that boric acid is not resolving. Sent metronidazole. Discussed increasing boric acid to 3 times a week the week of her period. Follow up as needed or if not improving.    Follow Up Instructions:    I discussed the assessment and treatment plan with the patient. The patient was provided an opportunity to ask questions and all were answered. The patient agreed with the plan and demonstrated an understanding of the instructions.   The patient was advised to call back or seek an in-person evaluation if the symptoms worsen or if the condition fails to improve as anticipated.  I provided 10 minutes of non-face-to-face time during this encounter.   Melanie Gaw, PA-C

## 2020-03-05 ENCOUNTER — Telehealth (INDEPENDENT_AMBULATORY_CARE_PROVIDER_SITE_OTHER): Payer: Commercial Managed Care - PPO | Admitting: Physician Assistant

## 2020-03-05 ENCOUNTER — Encounter: Payer: Self-pay | Admitting: Physician Assistant

## 2020-03-05 VITALS — Ht 63.0 in | Wt 150.0 lb

## 2020-03-05 DIAGNOSIS — F909 Attention-deficit hyperactivity disorder, unspecified type: Secondary | ICD-10-CM | POA: Diagnosis not present

## 2020-03-05 DIAGNOSIS — F341 Dysthymic disorder: Secondary | ICD-10-CM | POA: Diagnosis not present

## 2020-03-05 DIAGNOSIS — F419 Anxiety disorder, unspecified: Secondary | ICD-10-CM

## 2020-03-05 MED ORDER — AMPHETAMINE-DEXTROAMPHETAMINE 20 MG PO TABS: 20.0000 mg | ORAL_TABLET | Freq: Three times a day (TID) | ORAL | 0 refills | Status: DC

## 2020-03-05 NOTE — Progress Notes (Signed)
..Virtual Visit via Telephone Note  I connected with Melanie Fritz on 03/05/20 at  9:30 AM EST by telephone and verified that I am speaking with the correct person using two identifiers.  Location: Patient: home Provider: clinic  I discussed the limitations, risks, security and privacy concerns of performing an evaluation and management service by telephone and the availability of in person appointments. I also discussed with the patient that there may be a patient responsible charge related to this service. The patient expressed understanding and agreed to proceed.   History of Present Illness: Pt is a 34 yo female with ADHD, MDD, anxiety who presents to the clinic for medication refill.   She is doing great. Mood great. No problems or concerns with medication. She is sleeping well. No headaches or palpitations.   ... Active Ambulatory Problems    Diagnosis Date Noted  . Anxiety 04/13/2012  . History of Reoccuring BV 05/03/2012  . Bacterial vaginosis 05/18/2012  . Depression 06/06/2012  . Sprain of ankle, right 09/05/2012  . Adult ADHD 07/13/2014  . Left breast mass 05/09/2015  . Muscle spasm 02/23/2017  . Trouble in sleeping 05/03/2018  . Muscle twitching 05/03/2018  . Weight gain 02/27/2019  . Overweight 02/27/2019  . Recurrent cold sores 05/30/2019   Resolved Ambulatory Problems    Diagnosis Date Noted  . No Resolved Ambulatory Problems   No Additional Past Medical History   Reviewed med, allergy problem list.     Observations/Objective: No acute distress Normal mood No labored breathing  .Marland Kitchen Today's Vitals   03/05/20 0908  Weight: 150 lb (68 kg)  Height: 5\' 3"  (1.6 m)   Body mass index is 26.57 kg/m.  .. Depression screen [facility] 2/9 03/05/2020 02/27/2019 07/25/2018 05/03/2018 11/01/2017  Decreased Interest 0 0 0 0 0  Down, Depressed, Hopeless 0 0 0 0 0  PHQ - 2 Score 0 0 0 0 0  Altered sleeping - 0 1 1 0  Tired, decreased energy - 1 0 1 0  Change in appetite -  1 0 0 0  Feeling bad or failure about yourself  - 0 0 0 0  Trouble concentrating - 0 0 0 0  Moving slowly or fidgety/restless - 0 0 0 0  Suicidal thoughts - 0 0 0 0  PHQ-9 Score - 2 1 2  0  Difficult doing work/chores - Not difficult at all Somewhat difficult Not difficult at all -      Assessment and Plan: 01/02/2018 Melanie Fritz was seen today for adhd.  Diagnoses and all orders for this visit:  Adult ADHD -     amphetamine-dextroamphetamine (ADDERALL) 20 MG tablet; Take 1 tablet (20 mg total) by mouth 3 (three) times daily. -     amphetamine-dextroamphetamine (ADDERALL) 20 MG tablet; Take 1 tablet (20 mg total) by mouth 3 (three) times daily. -     amphetamine-dextroamphetamine (ADDERALL) 20 MG tablet; Take 1 tablet (20 mg total) by mouth 3 (three) times daily.  Anxiety  Dysthymia   Doing so well. Refilled adderall.  Mood stable.   3 month follow up.    Follow Up Instructions:    I discussed the assessment and treatment plan with the patient. The patient was provided an opportunity to ask questions and all were answered. The patient agreed with the plan and demonstrated an understanding of the instructions.   The patient was advised to call back or seek an in-person evaluation if the symptoms worsen or if the condition fails to improve as  anticipated.  I provided 7 minutes of non-face-to-face time during this encounter.   Tandy Gaw, PA-C

## 2020-05-31 ENCOUNTER — Encounter: Payer: Self-pay | Admitting: Physician Assistant

## 2020-05-31 ENCOUNTER — Telehealth (INDEPENDENT_AMBULATORY_CARE_PROVIDER_SITE_OTHER): Payer: Commercial Managed Care - PPO | Admitting: Physician Assistant

## 2020-05-31 DIAGNOSIS — F909 Attention-deficit hyperactivity disorder, unspecified type: Secondary | ICD-10-CM | POA: Diagnosis not present

## 2020-05-31 MED ORDER — AMPHETAMINE-DEXTROAMPHETAMINE 20 MG PO TABS: 20.0000 mg | ORAL_TABLET | Freq: Three times a day (TID) | ORAL | 0 refills | Status: DC

## 2020-05-31 NOTE — Progress Notes (Signed)
Needs refills of Adderall.  PHQ2 - 0

## 2020-05-31 NOTE — Progress Notes (Signed)
Patient ID: Melanie Fritz, female   DOB: 1986-02-01, 35 y.o.   MRN: [id number] .Marland KitchenVirtual Visit via Telephone Note  I connected with [name] on 05/31/20 at  9:50 AM EST by telephone and verified that I am speaking with the correct person using two identifiers.  Location: Patient: home Provider: clinic  .Marland KitchenParticipating in visit:  Patient: Melanie Fritz Provider: Tandy Gaw PA-C   I discussed the limitations, risks, security and privacy concerns of performing an evaluation and management service by telephone and the availability of in person appointments. I also discussed with the patient that there may be a patient responsible charge related to this service. The patient expressed understanding and agreed to proceed.   History of Present Illness: Patient is a 35 year old female with ADHD and history of depression who calls into the clinic for medication refill today.  She reports she is doing great on Adderall.  She has no concerns or complaints.  She denies any increase in anxiety, palpitations, headaches, vision changes, trouble sleeping.  She denies any suicidal thoughts or homicidal ideation.   .. Active Ambulatory Problems    Diagnosis Date Noted  . Anxiety 04/13/2012  . History of Reoccuring BV 05/03/2012  . Bacterial vaginosis 05/18/2012  . Depression 06/06/2012  . Sprain of ankle, right 09/05/2012  . Adult ADHD 07/13/2014  . Left breast mass 05/09/2015  . Muscle spasm 02/23/2017  . Trouble in sleeping 05/03/2018  . Muscle twitching 05/03/2018  . Weight gain 02/27/2019  . Overweight 02/27/2019  . Recurrent cold sores 05/30/2019   Resolved Ambulatory Problems    Diagnosis Date Noted  . No Resolved Ambulatory Problems   No Additional Past Medical History   Reviewed med, allergy, problem list.     Observations/Objective: No acute distress Normal mood.   .. Today's Vitals   05/31/20 0927  Temp: 98.3 F (36.8 C)  TempSrc: Oral  Weight: 150 lb (68 kg)  Height: 5\' 3"   (1.6 m)   Body mass index is 26.57 kg/m.   .. GAD 7 : Generalized Anxiety Score 02/27/2019 07/25/2018 05/03/2018 11/01/2017  Nervous, Anxious, on Edge 0 0 0 0  Control/stop worrying 1 0 0 0  Worry too much - different things 0 0 0 0  Trouble relaxing 0 0 0 0  Restless 0 0 0 0  Easily annoyed or irritable 0 0 0 0  Afraid - awful might happen 0 0 0 0  Total GAD 7 Score 1 0 0 0  Anxiety Difficulty Not difficult at all Not difficult at all Not difficult at all -     Depression screen [facility] 2/9 05/31/2020 03/05/2020 02/27/2019 07/25/2018 05/03/2018  Decreased Interest 0 0 0 0 0  Down, Depressed, Hopeless 0 0 0 0 0  PHQ - 2 Score 0 0 0 0 0  Altered sleeping - - 0 1 1  Tired, decreased energy - - 1 0 1  Change in appetite - - 1 0 0  Feeling bad or failure about yourself  - - 0 0 0  Trouble concentrating - - 0 0 0  Moving slowly or fidgety/restless - - 0 0 0  Suicidal thoughts - - 0 0 0  PHQ-9 Score - - 2 1 2   Difficult doing work/chores - - Not difficult at all Somewhat difficult Not difficult at all    Assessment and Plan: 07/02/2018 Shawntell was seen today for adhd.  Diagnoses and all orders for this visit:  Adult ADHD -     amphetamine-dextroamphetamine (ADDERALL) 20  MG tablet; Take 1 tablet (20 mg total) by mouth 3 (three) times daily. -     amphetamine-dextroamphetamine (ADDERALL) 20 MG tablet; Take 1 tablet (20 mg total) by mouth 3 (three) times daily. -     amphetamine-dextroamphetamine (ADDERALL) 20 MG tablet; Take 1 tablet (20 mg total) by mouth 3 (three) times daily.   Pt is doing great. No problems or concerns. Refilled for 3 months.    Follow Up Instructions:    I discussed the assessment and treatment plan with the patient. The patient was provided an opportunity to ask questions and all were answered. The patient agreed with the plan and demonstrated an understanding of the instructions.   The patient was advised to call back or seek an in-person evaluation if the symptoms worsen  or if the condition fails to improve as anticipated.  I provided 5 minutes of non-face-to-face time during this encounter.   Tandy Gaw, PA-C

## 2020-09-09 ENCOUNTER — Telehealth (INDEPENDENT_AMBULATORY_CARE_PROVIDER_SITE_OTHER): Payer: Commercial Managed Care - PPO | Admitting: Physician Assistant

## 2020-09-09 ENCOUNTER — Encounter: Payer: Self-pay | Admitting: Physician Assistant

## 2020-09-09 VITALS — BP 115/72 | Ht 63.0 in | Wt 150.0 lb

## 2020-09-09 DIAGNOSIS — Z79899 Other long term (current) drug therapy: Secondary | ICD-10-CM

## 2020-09-09 DIAGNOSIS — Z131 Encounter for screening for diabetes mellitus: Secondary | ICD-10-CM

## 2020-09-09 DIAGNOSIS — Z1329 Encounter for screening for other suspected endocrine disorder: Secondary | ICD-10-CM

## 2020-09-09 DIAGNOSIS — Z13220 Encounter for screening for lipoid disorders: Secondary | ICD-10-CM

## 2020-09-09 DIAGNOSIS — F909 Attention-deficit hyperactivity disorder, unspecified type: Secondary | ICD-10-CM

## 2020-09-09 MED ORDER — AMPHETAMINE-DEXTROAMPHETAMINE 20 MG PO TABS: 20.0000 mg | ORAL_TABLET | Freq: Three times a day (TID) | ORAL | 0 refills | Status: DC

## 2020-09-09 NOTE — Progress Notes (Signed)
Patient ID: Melanie Fritz, female   DOB: 22-Apr-1986, 35 y.o.   MRN: [id number] .Marland KitchenVirtual Visit via Telephone Note  I connected with [name] on 09/09/20 at  9:30 AM EDT by telephone and verified that I am speaking with the correct person using two identifiers.  Location: Patient: work Provider: clinic  .Marland KitchenParticipating in visit:  Patient: Gavyn Provider: Tandy Gaw PA-C Provider in training: Gypsy Decant PA-Student   I discussed the limitations, risks, security and privacy concerns of performing an evaluation and management service by telephone and the availability of in person appointments. I also discussed with the patient that there may be a patient responsible charge related to this service. The patient expressed understanding and agreed to proceed.   History of Present Illness: Pt is a 35 yo female with ADHD, MDD, Anxiety who calls into the clinic for medication refills.   Pt is doing well. No concerns or complaints. No palpitations, headaches, increase in anxiety. Doing well with work and mood.   .. Active Ambulatory Problems    Diagnosis Date Noted  . Anxiety 04/13/2012  . History of Reoccuring BV 05/03/2012  . Bacterial vaginosis 05/18/2012  . Depression 06/06/2012  . Sprain of ankle, right 09/05/2012  . Adult ADHD 07/13/2014  . Left breast mass 05/09/2015  . Muscle spasm 02/23/2017  . Trouble in sleeping 05/03/2018  . Muscle twitching 05/03/2018  . Weight gain 02/27/2019  . Overweight 02/27/2019  . Recurrent cold sores 05/30/2019   Resolved Ambulatory Problems    Diagnosis Date Noted  . No Resolved Ambulatory Problems   No Additional Past Medical History   Reviewed med, allergy, problem list.     Observations/Objective:   Assessment and Plan: Marland KitchenMarland KitchenDiagnoses and all orders for this visit:  Adult ADHD -     amphetamine-dextroamphetamine (ADDERALL) 20 MG tablet; Take 1 tablet (20 mg total) by mouth 3 (three) times daily. -     amphetamine-dextroamphetamine  (ADDERALL) 20 MG tablet; Take 1 tablet (20 mg total) by mouth 3 (three) times daily. -     amphetamine-dextroamphetamine (ADDERALL) 20 MG tablet; Take 1 tablet (20 mg total) by mouth 3 (three) times daily.  Screening for lipid disorders -     Lipid Panel w/reflex Direct LDL  Screening for diabetes mellitus -     COMPLETE METABOLIC PANEL WITH GFR  Thyroid disorder screening -     TSH  Medication management -     CBC with Differential/Platelet -     COMPLETE METABOLIC PANEL WITH GFR -     Lipid Panel w/reflex Direct LDL -     TSH   Refilled Adderall for 3 months.   Needs fasting labs.     Follow Up Instructions:    I discussed the assessment and treatment plan with the patient. The patient was provided an opportunity to ask questions and all were answered. The patient agreed with the plan and demonstrated an understanding of the instructions.   The patient was advised to call back or seek an in-person evaluation if the symptoms worsen or if the condition fails to improve as anticipated.  I provided 10 minutes of non-face-to-face time during this encounter.   Tandy Gaw, PA-C

## 2020-11-12 ENCOUNTER — Encounter: Payer: Self-pay | Admitting: Physician Assistant

## 2020-11-12 DIAGNOSIS — F909 Attention-deficit hyperactivity disorder, unspecified type: Secondary | ICD-10-CM

## 2020-11-12 MED ORDER — AMPHETAMINE-DEXTROAMPHETAMINE 20 MG PO TABS: 20.0000 mg | ORAL_TABLET | Freq: Three times a day (TID) | ORAL | 0 refills | Status: DC

## 2020-11-12 NOTE — Telephone Encounter (Signed)
Meds ordered this encounter  Medications  . amphetamine-dextroamphetamine (ADDERALL) 20 MG tablet    Sig: Take 1 tablet (20 mg total) by mouth 3 (three) times daily.    Dispense:  90 tablet    Refill:  0    

## 2020-12-02 ENCOUNTER — Encounter: Payer: Self-pay | Admitting: Physician Assistant

## 2020-12-11 ENCOUNTER — Encounter: Payer: Self-pay | Admitting: Physician Assistant

## 2020-12-11 ENCOUNTER — Telehealth (INDEPENDENT_AMBULATORY_CARE_PROVIDER_SITE_OTHER): Payer: Commercial Managed Care - PPO | Admitting: Physician Assistant

## 2020-12-11 DIAGNOSIS — F909 Attention-deficit hyperactivity disorder, unspecified type: Secondary | ICD-10-CM | POA: Diagnosis not present

## 2020-12-11 MED ORDER — AMPHETAMINE-DEXTROAMPHETAMINE 20 MG PO TABS
20.0000 mg | ORAL_TABLET | Freq: Three times a day (TID) | ORAL | 0 refills | Status: DC
Start: 2021-01-10 — End: 2021-03-11

## 2020-12-11 MED ORDER — AMPHETAMINE-DEXTROAMPHETAMINE 20 MG PO TABS: 20.0000 mg | ORAL_TABLET | Freq: Three times a day (TID) | ORAL | 0 refills | Status: DC

## 2020-12-11 NOTE — Progress Notes (Signed)
..  Virtual Visit via Telephone Note  I connected with Melanie Fritz on 12/11/20 at  9:30 AM EDT by telephone and verified that I am speaking with the correct person using two identifiers.  Location: Patient: home Provider: clinic  .Marland KitchenParticipating in visit:  Patient: Melanie Fritz Provider: Tandy Gaw PA-C   I discussed the limitations, risks, security and privacy concerns of performing an evaluation and management service by telephone and the availability of in person appointments. I also discussed with the patient that there may be a patient responsible charge related to this service. The patient expressed understanding and agreed to proceed.   History of Present Illness: Pt is a 35 yo female with ADHD and on Adderall. She is doing great with no concerns. Denies any insomnia, anxiety, palpitations, dizziness, headaches. She is doing well with focus and work.    .. Active Ambulatory Problems    Diagnosis Date Noted   Anxiety 04/13/2012   History of Reoccuring BV 05/03/2012   Bacterial vaginosis 05/18/2012   Depression 06/06/2012   Sprain of ankle, right 09/05/2012   Adult ADHD 07/13/2014   Left breast mass 05/09/2015   Muscle spasm 02/23/2017   Trouble in sleeping 05/03/2018   Muscle twitching 05/03/2018   Weight gain 02/27/2019   Overweight 02/27/2019   Recurrent cold sores 05/30/2019   Resolved Ambulatory Problems    Diagnosis Date Noted   No Resolved Ambulatory Problems   No Additional Past Medical History    Observations/Objective: No acute distress Normal breathing Normal mood and appearance    Assessment and Plan: Marland KitchenMarland KitchenNataly was seen today for adhd.  Diagnoses and all orders for this visit:  Adult ADHD -     amphetamine-dextroamphetamine (ADDERALL) 20 MG tablet; Take 1 tablet (20 mg total) by mouth 3 (three) times daily. -     amphetamine-dextroamphetamine (ADDERALL) 20 MG tablet; Take 1 tablet (20 mg total) by mouth 3 (three) times daily. -      amphetamine-dextroamphetamine (ADDERALL) 20 MG tablet; Take 1 tablet (20 mg total) by mouth 3 (three) times daily.  Refilled for 3 months.  Needs labs.     Follow Up Instructions:    I discussed the assessment and treatment plan with the patient. The patient was provided an opportunity to ask questions and all were answered. The patient agreed with the plan and demonstrated an understanding of the instructions.   The patient was advised to call back or seek an in-person evaluation if the symptoms worsen or if the condition fails to improve as anticipated.  I provided 5 minutes of non-face-to-face time during this encounter.   Tandy Gaw, PA-C

## 2020-12-13 ENCOUNTER — Ambulatory Visit: Payer: Commercial Managed Care - PPO | Admitting: Physician Assistant

## 2020-12-13 NOTE — Progress Notes (Signed)
Melanie Fritz,  Kidney, liver, glucose look great.  Cholesterol looks wonderful.  Thyroid is in normal range but TSH has increased a bit from 1 year ago. JJ, please add free T3,T4, antiTPO. Trending more towards HYPO thyroid. Are you having any HYPO thyroid symptoms? Weight gain/swelling/skin changes/fatigue

## 2020-12-16 LAB — THYROID PEROXIDASE ANTIBODIES (TPO) (REFL): Thyroperoxidase Ab SerPl-aCnc: <1 IU/mL (ref <9)

## 2020-12-16 LAB — COMPLETE METABOLIC PANEL WITH GFR
AG Ratio: 1.7 (calc) (ref 1.0–2.5)
ALT: 23 U/L (ref 6–29)
AST: 20 U/L (ref 10–30)
Albumin: 4.5 g/dL (ref 3.6–5.1)
Alkaline phosphatase (APISO): 72 U/L (ref 31–125)
BUN: 10 mg/dL (ref 7–25)
CO2: 29 mmol/L (ref 20–32)
Calcium: 9.8 mg/dL (ref 8.6–10.2)
Chloride: 102 mmol/L (ref 98–110)
Creat: 0.68 mg/dL (ref 0.50–0.97)
Globulin: 2.6 g/dL (calc) (ref 1.9–3.7)
Glucose, Bld: 89 mg/dL (ref 65–139)
Potassium: 4 mmol/L (ref 3.5–5.3)
Sodium: 138 mmol/L (ref 135–146)
Total Bilirubin: 0.4 mg/dL (ref 0.2–1.2)
Total Protein: 7.1 g/dL (ref 6.1–8.1)
eGFR: 117 mL/min/{1.73_m2} (ref >=60)

## 2020-12-16 LAB — CBC WITH DIFFERENTIAL/PLATELET
Absolute Monocytes: 648 cells/uL (ref 200–950)
Basophils Absolute: 20 cells/uL (ref 0–200)
Basophils Relative: 0.4 %
Eosinophils Absolute: 128 cells/uL (ref 15–500)
Eosinophils Relative: 2.5 %
HCT: 39.2 % (ref 35.0–45.0)
Hemoglobin: 13.3 g/dL (ref 11.7–15.5)
Lymphs Abs: 1193 cells/uL (ref 850–3900)
MCH: 31.5 pg (ref 27.0–33.0)
MCHC: 33.9 g/dL (ref 32.0–36.0)
MCV: 92.9 fL (ref 80.0–100.0)
MPV: 9.6 fL (ref 7.5–12.5)
Monocytes Relative: 12.7 %
Neutro Abs: 3111 cells/uL (ref 1500–7800)
Neutrophils Relative %: 61 %
Platelets: 260 10*3/uL (ref 140–400)
RBC: 4.22 10*6/uL (ref 3.80–5.10)
RDW: 12.3 % (ref 11.0–15.0)
Total Lymphocyte: 23.4 %
WBC: 5.1 10*3/uL (ref 3.8–10.8)

## 2020-12-16 LAB — T3, FREE: T3, Free: 3.1 pg/mL (ref 2.3–4.2)

## 2020-12-16 LAB — LIPID PANEL W/REFLEX DIRECT LDL
Cholesterol: 150 mg/dL (ref <200)
HDL: 76 mg/dL (ref >=50)
LDL Cholesterol (Calc): 58 mg/dL (calc)
Non-HDL Cholesterol (Calc): 74 mg/dL (calc) (ref <130)
Total CHOL/HDL Ratio: 2 (calc) (ref <5.0)
Triglycerides: 78 mg/dL (ref <150)

## 2020-12-16 LAB — T4, FREE: Free T4: 1 ng/dL (ref 0.8–1.8)

## 2020-12-16 LAB — TSH: TSH: 3.49 mIU/L

## 2020-12-16 NOTE — Progress Notes (Signed)
T4 and T3 in normal range.

## 2020-12-16 NOTE — Progress Notes (Signed)
No antibodies to thyroid. Will keep an eye on this recheck in 6 months.

## 2021-03-12 ENCOUNTER — Encounter: Payer: Self-pay | Admitting: Physician Assistant

## 2021-03-12 ENCOUNTER — Telehealth (INDEPENDENT_AMBULATORY_CARE_PROVIDER_SITE_OTHER): Payer: Commercial Managed Care - PPO | Admitting: Physician Assistant

## 2021-03-12 DIAGNOSIS — F909 Attention-deficit hyperactivity disorder, unspecified type: Secondary | ICD-10-CM | POA: Diagnosis not present

## 2021-03-12 MED ORDER — AMPHETAMINE-DEXTROAMPHETAMINE 20 MG PO TABS
20.0000 mg | ORAL_TABLET | Freq: Three times a day (TID) | ORAL | 0 refills | Status: DC
Start: 2021-05-11 — End: 2021-06-12

## 2021-03-12 MED ORDER — AMPHETAMINE-DEXTROAMPHETAMINE 20 MG PO TABS: 20.0000 mg | ORAL_TABLET | Freq: Three times a day (TID) | ORAL | 0 refills | Status: DC

## 2021-03-12 NOTE — Progress Notes (Signed)
..  Virtual Visit via Video Note  I connected with Melanie Fritz on 03/12/21 at  9:10 AM EST by a video enabled telemedicine application and verified that I am speaking with the correct person using two identifiers.  Location: Patient: car Provider: clinic  .Marland KitchenParticipating in visit:  Patient: Melanie Fritz Provider: Tandy Gaw PA-C   I discussed the limitations of evaluation and management by telemedicine and the availability of in person appointments. The patient expressed understanding and agreed to proceed.  History of Present Illness: Pt is a 36 yo female with ADHD. She is taking adderall and doing well. No problems or concerns. 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 Mood is great. Some fatigue but thinks it could be the time change.   .. Active Ambulatory Problems    Diagnosis Date Noted   Anxiety 04/13/2012   History of Reoccuring BV 05/03/2012   Bacterial vaginosis 05/18/2012   Depression 06/06/2012   Sprain of ankle, right 09/05/2012   Adult ADHD 07/13/2014   Left breast mass 05/09/2015   Muscle spasm 02/23/2017   Trouble in sleeping 05/03/2018   Muscle twitching 05/03/2018   Weight gain 02/27/2019   Overweight 02/27/2019   Recurrent cold sores 05/30/2019   Resolved Ambulatory Problems    Diagnosis Date Noted   No Resolved Ambulatory Problems   No Additional Past Medical History       Observations/Objective: No acute distress Normal mood and appearance No labored breathing  .Marland Kitchen Today's Vitals   03/12/21 0756  BP: 122/78   There is no height or weight on file to calculate BMI.    Assessment and Plan: Marland KitchenMarland KitchenTerriana was seen today for adhd.  Diagnoses and all orders for this visit:  Adult ADHD -     amphetamine-dextroamphetamine (ADDERALL) 20 MG tablet; Take 1 tablet (20 mg total) by mouth 3 (three) times daily. -     amphetamine-dextroamphetamine (ADDERALL) 20 MG tablet; Take 1 tablet (20 mg total) by mouth 3 (three) times daily. -     amphetamine-dextroamphetamine (ADDERALL)  20 MG tablet; Take 1 tablet (20 mg total) by mouth 3 (three) times daily.  Refilled for 3 months.    Follow Up Instructions:    I discussed the assessment and treatment plan with the patient. The patient was provided an opportunity to ask questions and all were answered. The patient agreed with the plan and demonstrated an understanding of the instructions.   The patient was advised to call back or seek an in-person evaluation if the symptoms worsen or if the condition fails to improve as anticipated.    Tandy Gaw, PA-C

## 2021-06-13 ENCOUNTER — Telehealth (INDEPENDENT_AMBULATORY_CARE_PROVIDER_SITE_OTHER): Payer: Commercial Managed Care - PPO | Admitting: Physician Assistant

## 2021-06-13 ENCOUNTER — Encounter: Payer: Self-pay | Admitting: Physician Assistant

## 2021-06-13 DIAGNOSIS — F909 Attention-deficit hyperactivity disorder, unspecified type: Secondary | ICD-10-CM | POA: Diagnosis not present

## 2021-06-13 MED ORDER — AMPHETAMINE-DEXTROAMPHETAMINE 20 MG PO TABS: 20.0000 mg | ORAL_TABLET | Freq: Three times a day (TID) | ORAL | 0 refills | Status: DC

## 2021-06-13 NOTE — Progress Notes (Signed)
Attempted to call at 9:37am left vm

## 2021-06-13 NOTE — Progress Notes (Signed)
..  Virtual Visit via Video Note  I connected with Melanie Fritz on 06/13/21 at  9:50 AM EST by a video enabled telemedicine application and verified that I am speaking with the correct person using two identifiers.  Location: Patient: work Provider: clinic  .Marland KitchenParticipating in visit:  Patient: Melanie Fritz Provider: Tandy Gaw PA-C   I discussed the limitations of evaluation and management by telemedicine and the availability of in person appointments. The patient expressed understanding and agreed to proceed.  History of Present Illness: Pt is a 36 yo female with ADHD needing refills.   She denies any problems. She is doing well on dose. She is doing well at work. No increase in anxiety or problems with sleep.    .. Active Ambulatory Problems    Diagnosis Date Noted   Anxiety 04/13/2012   History of Reoccuring BV 05/03/2012   Bacterial vaginosis 05/18/2012   Depression 06/06/2012   Sprain of ankle, right 09/05/2012   Adult ADHD 07/13/2014   Left breast mass 05/09/2015   Muscle spasm 02/23/2017   Trouble in sleeping 05/03/2018   Muscle twitching 05/03/2018   Weight gain 02/27/2019   Overweight 02/27/2019   Recurrent cold sores 05/30/2019   Resolved Ambulatory Problems    Diagnosis Date Noted   No Resolved Ambulatory Problems   No Additional Past Medical History    Observations/Objective: No acute distress Normal mood and appearance Normal breathing  .Marland Kitchen Today's Vitals   06/13/21 0954  BP: 111/76  Pulse: 78   There is no height or weight on file to calculate BMI.    Assessment and Plan: Marland KitchenMarland KitchenDiagnoses and all orders for this visit:  Adult ADHD -     amphetamine-dextroamphetamine (ADDERALL) 20 MG tablet; Take 1 tablet (20 mg total) by mouth 3 (three) times daily. -     amphetamine-dextroamphetamine (ADDERALL) 20 MG tablet; Take 1 tablet (20 mg total) by mouth 3 (three) times daily. -     amphetamine-dextroamphetamine (ADDERALL) 20 MG tablet; Take 1 tablet (20 mg  total) by mouth 3 (three) times daily.   3 month follow up.  BP good/mood good. No concerns.    Follow Up Instructions:    I discussed the assessment and treatment plan with the patient. The patient was provided an opportunity to ask questions and all were answered. The patient agreed with the plan and demonstrated an understanding of the instructions.   The patient was advised to call back or seek an in-person evaluation if the symptoms worsen or if the condition fails to improve as anticipated.   Tandy Gaw, PA-C

## 2021-06-16 ENCOUNTER — Encounter: Payer: Self-pay | Admitting: Physician Assistant

## 2021-06-16 DIAGNOSIS — F909 Attention-deficit hyperactivity disorder, unspecified type: Secondary | ICD-10-CM

## 2021-06-16 MED ORDER — AMPHETAMINE-DEXTROAMPHETAMINE 20 MG PO TABS: 20.0000 mg | ORAL_TABLET | Freq: Three times a day (TID) | ORAL | 0 refills | Status: DC

## 2021-09-10 ENCOUNTER — Encounter: Payer: Self-pay | Admitting: Physician Assistant

## 2021-09-10 ENCOUNTER — Telehealth (INDEPENDENT_AMBULATORY_CARE_PROVIDER_SITE_OTHER): Payer: Commercial Managed Care - PPO | Admitting: Physician Assistant

## 2021-09-10 DIAGNOSIS — F909 Attention-deficit hyperactivity disorder, unspecified type: Secondary | ICD-10-CM | POA: Diagnosis not present

## 2021-09-10 MED ORDER — AMPHETAMINE-DEXTROAMPHETAMINE 20 MG PO TABS: 20.0000 mg | ORAL_TABLET | Freq: Three times a day (TID) | ORAL | 0 refills | Status: DC

## 2021-09-10 NOTE — Progress Notes (Signed)
Needs Adderall refills ?No other issues.  ?

## 2021-09-10 NOTE — Progress Notes (Signed)
..  Virtual Visit via Video Note ? ?I connected with Melanie Fritz on 09/10/21 at  7:30 AM EDT by a video enabled telemedicine application and verified that I am speaking with the correct person using two identifiers. ? ?Location: ?Patient: car ?Provider: clinic ? ?Marland Kitchen.Participating in visit:  ?Patient: Melanie Fritz ?Provider: Iran Planas PA-C ?  ?I discussed the limitations of evaluation and management by telemedicine and the availability of in person appointments. The patient expressed understanding and agreed to proceed. ? ?History of Present Illness: ?Pt is a 36 yo female with ADHD who needs refills. No problems or concerns. Doing well with work. No mood changes. Sleeping well.  ? ?.. ?Active Ambulatory Problems  ?  Diagnosis Date Noted  ? Anxiety 04/13/2012  ? History of Reoccuring BV 05/03/2012  ? Bacterial vaginosis 05/18/2012  ? Depression 06/06/2012  ? Sprain of ankle, right 09/05/2012  ? Adult ADHD 07/13/2014  ? Left breast mass 05/09/2015  ? Muscle spasm 02/23/2017  ? Trouble in sleeping 05/03/2018  ? Muscle twitching 05/03/2018  ? Weight gain 02/27/2019  ? Overweight 02/27/2019  ? Recurrent cold sores 05/30/2019  ? ?Resolved Ambulatory Problems  ?  Diagnosis Date Noted  ? No Resolved Ambulatory Problems  ? ?No Additional Past Medical History  ? ? ? ?  ?Observations/Objective: ?No acute distress ?Normal mood and appearance ?Normal breathing ? ?.. ?Today's Vitals  ? 09/10/21 0711  ?BP: 122/78  ?Weight: 158 lb (71.7 kg)  ?Height: 5\' 3"  (1.6 m)  ? ?Body mass index is 27.99 kg/m?. ? ? ? ?Assessment and Plan: ?..Melanie Fritz was seen today for adhd. ? ?Diagnoses and all orders for this visit: ? ?Adult ADHD ?-     amphetamine-dextroamphetamine (ADDERALL) 20 MG tablet; Take 1 tablet (20 mg total) by mouth 3 (three) times daily. ?-     amphetamine-dextroamphetamine (ADDERALL) 20 MG tablet; Take 1 tablet (20 mg total) by mouth 3 (three) times daily. ?-     amphetamine-dextroamphetamine (ADDERALL) 20 MG tablet; Take 1 tablet (20  mg total) by mouth 3 (three) times daily. ? ? ?Doing well ?Adderall refilled for 3 months.  ? ?Follow Up Instructions: ? ?  ?I discussed the assessment and treatment plan with the patient. The patient was provided an opportunity to ask questions and all were answered. The patient agreed with the plan and demonstrated an understanding of the instructions. ?  ?The patient was advised to call back or seek an in-person evaluation if the symptoms worsen or if the condition fails to improve as anticipated. ? ? ? ?Iran Planas, PA-C ? ?

## 2021-12-10 ENCOUNTER — Telehealth (INDEPENDENT_AMBULATORY_CARE_PROVIDER_SITE_OTHER): Payer: Commercial Managed Care - PPO | Admitting: Physician Assistant

## 2021-12-10 ENCOUNTER — Encounter: Payer: Self-pay | Admitting: Physician Assistant

## 2021-12-10 VITALS — Ht 63.0 in | Wt 158.0 lb

## 2021-12-10 DIAGNOSIS — Z13220 Encounter for screening for lipoid disorders: Secondary | ICD-10-CM

## 2021-12-10 DIAGNOSIS — Z131 Encounter for screening for diabetes mellitus: Secondary | ICD-10-CM | POA: Diagnosis not present

## 2021-12-10 DIAGNOSIS — F909 Attention-deficit hyperactivity disorder, unspecified type: Secondary | ICD-10-CM

## 2021-12-10 DIAGNOSIS — R7989 Other specified abnormal findings of blood chemistry: Secondary | ICD-10-CM

## 2021-12-10 DIAGNOSIS — N898 Other specified noninflammatory disorders of vagina: Secondary | ICD-10-CM

## 2021-12-10 MED ORDER — AMPHETAMINE-DEXTROAMPHETAMINE 20 MG PO TABS: 20.0000 mg | ORAL_TABLET | Freq: Three times a day (TID) | ORAL | 0 refills | Status: DC

## 2021-12-10 MED ORDER — METRONIDAZOLE 500 MG PO TABS: 500.0000 mg | ORAL_TABLET | Freq: Two times a day (BID) | ORAL | 0 refills | Status: AC

## 2021-12-10 NOTE — Progress Notes (Signed)
..Virtual Visit via Video Note  I connected with Melanie Fritz on 12/10/21 at  8:50 AM EDT by a video enabled telemedicine application and verified that I am speaking with the correct person using two identifiers.  Location: Patient: car Provider: clinic  .Marland KitchenParticipating in visit:  Patient: Melanie Fritz Provider: Tandy Gaw PA-C  I discussed the limitations of evaluation and management by telemedicine and the availability of in person appointments. The patient expressed understanding and agreed to proceed.  History of Present Illness: Pt is a 36 yo female with ADHD, GAD, Depression who needs medication refills.   Pt is doing well. She is taking adderall twice a day right now due to work being slower and some insomnia issues at night. Her wife is due in 9 weeks and she thinks she is already anxious about that. She is not taking anything OTC.   She does have some vaginal discharge/odor with hx of BV. Would like treatment. No abdominal pain or dysuria.   .. Active Ambulatory Problems    Diagnosis Date Noted   Anxiety 04/13/2012   History of Reoccuring BV 05/03/2012   Bacterial vaginosis 05/18/2012   Depression 06/06/2012   Sprain of ankle, right 09/05/2012   Adult ADHD 07/13/2014   Left breast mass 05/09/2015   Muscle spasm 02/23/2017   Trouble in sleeping 05/03/2018   Muscle twitching 05/03/2018   Weight gain 02/27/2019   Overweight 02/27/2019   Recurrent cold sores 05/30/2019   Resolved Ambulatory Problems    Diagnosis Date Noted   No Resolved Ambulatory Problems   No Additional Past Medical History       Observations/Objective: No acute distress Normal mood and appearance  .Marland Kitchen Today's Vitals   12/10/21 0833  Weight: 158 lb (71.7 kg)  Height: 5\' 3"  (1.6 m)   Body mass index is 27.99 kg/m.    Assessment and Plan: Marland KitchenColton was seen today for adhd.  Diagnoses and all orders for this visit:  Adult ADHD -     amphetamine-dextroamphetamine (ADDERALL) 20 MG  tablet; Take 1 tablet (20 mg total) by mouth 3 (three) times daily. -     amphetamine-dextroamphetamine (ADDERALL) 20 MG tablet; Take 1 tablet (20 mg total) by mouth 3 (three) times daily. -     amphetamine-dextroamphetamine (ADDERALL) 20 MG tablet; Take 1 tablet (20 mg total) by mouth 3 (three) times daily. -     CBC w/Diff/Platelet  Vaginal discharge -     CBC w/Diff/Platelet -     metroNIDAZOLE (FLAGYL) 500 MG tablet; Take 1 tablet (500 mg total) by mouth 2 (two) times daily for 7 days.  Vaginal odor -     CBC w/Diff/Platelet -     metroNIDAZOLE (FLAGYL) 500 MG tablet; Take 1 tablet (500 mg total) by mouth 2 (two) times daily for 7 days.  Elevated TSH -     TSH -     T4, free -     T3, free  Screening for diabetes mellitus -     COMPLETE METABOLIC PANEL WITH GFR  Screening for lipid disorders -     Lipid Panel w/reflex Direct LDL  Needs labs Last TSH a little elevated reordered today  Refilled adderall for 3 months  Discussed calm aid or unisom for sleep  Metronidazole sent for BV. Discussed if not improving needs to follow up for complete work up.    Follow Up Instructions:    I discussed the assessment and treatment plan with the patient. The patient was provided an opportunity  to ask questions and all were answered. The patient agreed with the plan and demonstrated an understanding of the instructions.   The patient was advised to call back or seek an in-person evaluation if the symptoms worsen or if the condition fails to improve as anticipated.    Iran Planas, PA-C

## 2021-12-13 LAB — COMPLETE METABOLIC PANEL WITH GFR
AG Ratio: 2 (calc) (ref 1.0–2.5)
ALT: 26 U/L (ref 6–29)
AST: 25 U/L (ref 10–30)
Albumin: 4.4 g/dL (ref 3.6–5.1)
Alkaline phosphatase (APISO): 59 U/L (ref 31–125)
BUN: 13 mg/dL (ref 7–25)
CO2: 26 mmol/L (ref 20–32)
Calcium: 9.3 mg/dL (ref 8.6–10.2)
Chloride: 103 mmol/L (ref 98–110)
Creat: 0.82 mg/dL (ref 0.50–0.97)
Globulin: 2.2 g/dL (calc) (ref 1.9–3.7)
Glucose, Bld: 66 mg/dL (ref 65–99)
Potassium: 4.3 mmol/L (ref 3.5–5.3)
Sodium: 138 mmol/L (ref 135–146)
Total Bilirubin: 0.6 mg/dL (ref 0.2–1.2)
Total Protein: 6.6 g/dL (ref 6.1–8.1)
eGFR: 96 mL/min/{1.73_m2} (ref >=60)

## 2021-12-13 LAB — CBC WITH DIFFERENTIAL/PLATELET
Absolute Monocytes: 515 cells/uL (ref 200–950)
Basophils Absolute: 29 cells/uL (ref 0–200)
Basophils Relative: 0.6 %
Eosinophils Absolute: 78 cells/uL (ref 15–500)
Eosinophils Relative: 1.6 %
HCT: 37.2 % (ref 35.0–45.0)
Hemoglobin: 12.8 g/dL (ref 11.7–15.5)
Lymphs Abs: 1235 cells/uL (ref 850–3900)
MCH: 32.1 pg (ref 27.0–33.0)
MCHC: 34.4 g/dL (ref 32.0–36.0)
MCV: 93.2 fL (ref 80.0–100.0)
MPV: 9.8 fL (ref 7.5–12.5)
Monocytes Relative: 10.5 %
Neutro Abs: 3043 cells/uL (ref 1500–7800)
Neutrophils Relative %: 62.1 %
Platelets: 256 10*3/uL (ref 140–400)
RBC: 3.99 10*6/uL (ref 3.80–5.10)
RDW: 12.1 % (ref 11.0–15.0)
Total Lymphocyte: 25.2 %
WBC: 4.9 10*3/uL (ref 3.8–10.8)

## 2021-12-13 LAB — T4, FREE: Free T4: 1 ng/dL (ref 0.8–1.8)

## 2021-12-13 LAB — LIPID PANEL W/REFLEX DIRECT LDL
Cholesterol: 169 mg/dL (ref <200)
HDL: 78 mg/dL (ref >=50)
LDL Cholesterol (Calc): 75 mg/dL (calc)
Non-HDL Cholesterol (Calc): 91 mg/dL (calc) (ref <130)
Total CHOL/HDL Ratio: 2.2 (calc) (ref <5.0)
Triglycerides: 81 mg/dL (ref <150)

## 2021-12-13 LAB — TSH: TSH: 1.74 mIU/L

## 2021-12-13 LAB — T3, FREE: T3, Free: 3.7 pg/mL (ref 2.3–4.2)

## 2021-12-15 NOTE — Progress Notes (Signed)
Shere,   Cholesterol looks amazing.  Thyroid is wonderful.  Kidney, liver, glucose looks great.

## 2022-03-06 ENCOUNTER — Telehealth (INDEPENDENT_AMBULATORY_CARE_PROVIDER_SITE_OTHER): Payer: Commercial Managed Care - PPO | Admitting: Physician Assistant

## 2022-03-06 ENCOUNTER — Encounter: Payer: Self-pay | Admitting: Physician Assistant

## 2022-03-06 DIAGNOSIS — F909 Attention-deficit hyperactivity disorder, unspecified type: Secondary | ICD-10-CM | POA: Diagnosis not present

## 2022-03-06 DIAGNOSIS — F341 Dysthymic disorder: Secondary | ICD-10-CM | POA: Diagnosis not present

## 2022-03-06 DIAGNOSIS — F419 Anxiety disorder, unspecified: Secondary | ICD-10-CM

## 2022-03-06 MED ORDER — AMPHETAMINE-DEXTROAMPHETAMINE 20 MG PO TABS: 20.0000 mg | ORAL_TABLET | Freq: Two times a day (BID) | ORAL | 0 refills | Status: DC

## 2022-03-06 MED ORDER — CLONAZEPAM 0.5 MG PO TABS: 0.2500 mg | ORAL_TABLET | Freq: Three times a day (TID) | ORAL | 1 refills | Status: DC | PRN

## 2022-03-06 MED ORDER — ESCITALOPRAM OXALATE 5 MG PO TABS: 5.0000 mg | ORAL_TABLET | Freq: Every day | ORAL | 0 refills | Status: DC

## 2022-03-06 NOTE — Progress Notes (Signed)
PHQ9 (4) -GAD7 (5) completed.   Having some trouble with depression Going to gym, etc Getting worse the last 2 months  Has taken medication in the past  Clonazepam, Paxil, Lexapro, Trintellix, Effexor  Does not remember if they were effective or if she actually took them but they were in her past medical record

## 2022-03-06 NOTE — Progress Notes (Signed)
..Virtual Visit via Video Note  I connected with [name] on 03/06/22 at  9:10 AM EST by a video enabled telemedicine application and verified that I am speaking with the correct person using two identifiers.  Location: Patient: work Provider: clinic  .Marland KitchenParticipating in visit:  Patient: Melanie Fritz Provider: Tandy Gaw PA-C   I discussed the limitations of evaluation and management by telemedicine and the availability of in person appointments. The patient expressed understanding and agreed to proceed.  History of Present Illness: Pt is a 36 yo female with hx of anxiety, depression, ADHD who needs refills. Her anxiety and depression have worsened. She has been on medication for anxiety and depression in the past but not in the last year. She feels like lexapro was effective. No SI/HC. She does have a new 74 week old baby at home. No other life changes. She has decreased down to adderall bid from tid. She is sleeping well. She has tried going to gym but nothing seems to be helping. She is having acute anxiety that leaves her flushed and chest tightness.    .. Active Ambulatory Problems    Diagnosis Date Noted   Anxiety 04/13/2012   History of Reoccuring BV 05/03/2012   Bacterial vaginosis 05/18/2012   Depression 06/06/2012   Sprain of ankle, right 09/05/2012   Adult ADHD 07/13/2014   Left breast mass 05/09/2015   Muscle spasm 02/23/2017   Trouble in sleeping 05/03/2018   Muscle twitching 05/03/2018   Weight gain 02/27/2019   Overweight 02/27/2019   Recurrent cold sores 05/30/2019   Resolved Ambulatory Problems    Diagnosis Date Noted   No Resolved Ambulatory Problems   No Additional Past Medical History    Observations/Objective: No Acute Distress  .Marland Kitchen Today's Vitals   03/06/22 0834  BP: 122/80  Weight: 162 lb (73.5 kg)  Height: 5\' 3"  (1.6 m)   Body mass index is 28.7 kg/m.   ..    03/06/2022    8:35 AM 09/09/2020    9:27 AM 05/31/2020    9:28 AM 03/05/2020     9:09 AM 02/27/2019    9:39 AM  Depression screen PHQ 2/9  Decreased Interest 1 0 0 0 0  Down, Depressed, Hopeless 1 0 0 0 0  PHQ - 2 Score 2 0 0 0 0  Altered sleeping 0    0  Tired, decreased energy 1    1  Change in appetite 0    1  Feeling bad or failure about yourself  0    0  Trouble concentrating 0    0  Moving slowly or fidgety/restless 1    0  Suicidal thoughts 0    0  PHQ-9 Score 4    2  Difficult doing work/chores Somewhat difficult    Not difficult at all   ..    03/06/2022    8:37 AM 02/27/2019    9:39 AM 07/25/2018    8:55 AM 05/03/2018    8:12 AM  GAD 7 : Generalized Anxiety Score  Nervous, Anxious, on Edge 1 0 0 0  Control/stop worrying 1 1 0 0  Worry too much - different things 1 0 0 0  Trouble relaxing 1 0 0 0  Restless 0 0 0 0  Easily annoyed or irritable 1 0 0 0  Afraid - awful might happen 0 0 0 0  Total GAD 7 Score 5 1 0 0  Anxiety Difficulty Somewhat difficult Not difficult at all Not difficult  at all Not difficult at all     Assessment and Plan: Marland KitchenMarland KitchenClarissa was seen today for depression.  Diagnoses and all orders for this visit:  Anxiety -     escitalopram (LEXAPRO) 5 MG tablet; Take 1 tablet (5 mg total) by mouth daily. -     clonazePAM (KLONOPIN) 0.5 MG tablet; Take 0.5 tablets (0.25 mg total) by mouth 3 (three) times daily as needed for anxiety.  Dysthymia -     escitalopram (LEXAPRO) 5 MG tablet; Take 1 tablet (5 mg total) by mouth daily.  Adult ADHD -     amphetamine-dextroamphetamine (ADDERALL) 20 MG tablet; Take 1 tablet (20 mg total) by mouth 2 (two) times daily. -     amphetamine-dextroamphetamine (ADDERALL) 20 MG tablet; Take 1 tablet (20 mg total) by mouth 2 (two) times daily. -     amphetamine-dextroamphetamine (ADDERALL) 20 MG tablet; Take 1 tablet (20 mg total) by mouth 2 (two) times daily.   Sent adderall bid to pharmacy for 3 months Restart lexapro Klonapin as needed Follow up in 3 months   Follow Up Instructions:    I  discussed the assessment and treatment plan with the patient. The patient was provided an opportunity to ask questions and all were answered. The patient agreed with the plan and demonstrated an understanding of the instructions.   The patient was advised to call back or seek an in-person evaluation if the symptoms worsen or if the condition fails to improve as anticipated.     Tandy Gaw, PA-C

## 2022-03-13 ENCOUNTER — Telehealth: Payer: Commercial Managed Care - PPO | Admitting: Physician Assistant

## 2022-05-05 ENCOUNTER — Encounter: Payer: Self-pay | Admitting: Physician Assistant

## 2022-05-05 DIAGNOSIS — F419 Anxiety disorder, unspecified: Secondary | ICD-10-CM

## 2022-05-06 MED ORDER — CLONAZEPAM 0.5 MG PO TABS: 0.2500 mg | ORAL_TABLET | Freq: Three times a day (TID) | ORAL | 1 refills | Status: DC | PRN

## 2022-05-11 MED ORDER — ESCITALOPRAM OXALATE 10 MG PO TABS: 10.0000 mg | ORAL_TABLET | Freq: Every day | ORAL | 0 refills | Status: DC

## 2022-05-11 NOTE — Telephone Encounter (Signed)
Mood improving on 10mg  of lexapro. Sent refills to make it until follow up.

## 2022-05-11 NOTE — Addendum Note (Signed)
Addended by: Donella Stade on: 05/11/2022 11:56 AM   Modules accepted: Orders

## 2022-06-03 ENCOUNTER — Encounter: Payer: Self-pay | Admitting: Physician Assistant

## 2022-06-03 ENCOUNTER — Telehealth (INDEPENDENT_AMBULATORY_CARE_PROVIDER_SITE_OTHER): Payer: Commercial Managed Care - PPO | Admitting: Physician Assistant

## 2022-06-03 DIAGNOSIS — F419 Anxiety disorder, unspecified: Secondary | ICD-10-CM | POA: Diagnosis not present

## 2022-06-03 DIAGNOSIS — N898 Other specified noninflammatory disorders of vagina: Secondary | ICD-10-CM | POA: Diagnosis not present

## 2022-06-03 DIAGNOSIS — F909 Attention-deficit hyperactivity disorder, unspecified type: Secondary | ICD-10-CM

## 2022-06-03 MED ORDER — AMPHETAMINE-DEXTROAMPHETAMINE 20 MG PO TABS: 20.0000 mg | ORAL_TABLET | Freq: Two times a day (BID) | ORAL | 0 refills | Status: DC

## 2022-06-03 MED ORDER — METRONIDAZOLE 500 MG PO TABS: 500.0000 mg | ORAL_TABLET | Freq: Two times a day (BID) | ORAL | 0 refills | Status: AC

## 2022-06-03 MED ORDER — ESCITALOPRAM OXALATE 10 MG PO TABS: 10.0000 mg | ORAL_TABLET | Freq: Every day | ORAL | 1 refills | Status: DC

## 2022-06-03 NOTE — Progress Notes (Signed)
..Virtual Visit via Video Note  I connected with Melanie Fritz on 06/03/22 at  7:10 AM EST by a video enabled telemedicine application and verified that I am speaking with the correct person using two identifiers.  Location: Patient: home Provider: clinic  .Marland KitchenParticipating in visit:  Patient: Melanie Fritz Provider: Iran Planas PA-C   I discussed the limitations of evaluation and management by telemedicine and the availability of in person appointments. The patient expressed understanding and agreed to proceed.  History of Present Illness: Pt is a 37 yo female with GAD and ADHD who needs refills.   She is doing really well. She has no concerns with medications today. She is doing well at work and at home.   She is having some vaginal odor and discharge. Hx of BV. She is out of boric acid and thinks she has a flare up.  .. Active Ambulatory Problems    Diagnosis Date Noted   Anxiety 04/13/2012   History of Reoccuring BV 05/03/2012   Bacterial vaginosis 05/18/2012   Depression 06/06/2012   Sprain of ankle, right 09/05/2012   Adult ADHD 07/13/2014   Left breast mass 05/09/2015   Muscle spasm 02/23/2017   Trouble in sleeping 05/03/2018   Muscle twitching 05/03/2018   Weight gain 02/27/2019   Overweight 02/27/2019   Recurrent cold sores 05/30/2019   Resolved Ambulatory Problems    Diagnosis Date Noted   No Resolved Ambulatory Problems   No Additional Past Medical History       Observations/Objective: .Marland Kitchen    06/03/2022    7:20 AM 03/06/2022    8:35 AM 09/09/2020    9:27 AM 05/31/2020    9:28 AM 03/05/2020    9:09 AM  Depression screen PHQ 2/9  Decreased Interest 0 1 0 0 0  Down, Depressed, Hopeless 0 1 0 0 0  PHQ - 2 Score 0 2 0 0 0  Altered sleeping 0 0     Tired, decreased energy 0 1     Change in appetite 0 0     Feeling bad or failure about yourself  0 0     Trouble concentrating 0 0     Moving slowly or fidgety/restless 0 1     Suicidal thoughts 0 0     PHQ-9 Score  0 4     Difficult doing work/chores Not difficult at all Somewhat difficult      ..    06/03/2022    7:22 AM 03/06/2022    8:37 AM 02/27/2019    9:39 AM 07/25/2018    8:55 AM  GAD 7 : Generalized Anxiety Score  Nervous, Anxious, on Edge 1 1 0 0  Control/stop worrying 1 1 1  0  Worry too much - different things 1 1 0 0  Trouble relaxing 1 1 0 0  Restless 0 0 0 0  Easily annoyed or irritable 0 1 0 0  Afraid - awful might happen 0 0 0 0  Total GAD 7 Score 4 5 1  0  Anxiety Difficulty Somewhat difficult Somewhat difficult Not difficult at all Not difficult at all    No acute distress Normal mood and appearance Normal breathing  Assessment and Plan: Marland KitchenMarland KitchenRayyan was seen today for follow-up.  Diagnoses and all orders for this visit:  Adult ADHD -     amphetamine-dextroamphetamine (ADDERALL) 20 MG tablet; Take 1 tablet (20 mg total) by mouth 2 (two) times daily. -     amphetamine-dextroamphetamine (ADDERALL) 20 MG tablet; Take 1 tablet (  20 mg total) by mouth 2 (two) times daily. -     amphetamine-dextroamphetamine (ADDERALL) 20 MG tablet; Take 1 tablet (20 mg total) by mouth 2 (two) times daily.  Anxiety -     escitalopram (LEXAPRO) 10 MG tablet; Take 1 tablet (10 mg total) by mouth daily.  Vaginal odor -     metroNIDAZOLE (FLAGYL) 500 MG tablet; Take 1 tablet (500 mg total) by mouth 2 (two) times daily for 7 days.  Vaginal discharge -     metroNIDAZOLE (FLAGYL) 500 MG tablet; Take 1 tablet (500 mg total) by mouth 2 (two) times daily for 7 days.   Adderall refilled for 3 months PHQ/GAD numbers improved Refilled lexapro Hx of BV empirically treated with metronidazole Follow up if discharge/odor persist Needs pap, please come into office and have done    Follow Up Instructions:    I discussed the assessment and treatment plan with the patient. The patient was provided an opportunity to ask questions and all were answered. The patient agreed with the plan and demonstrated an  understanding of the instructions.   The patient was advised to call back or seek an in-person evaluation if the symptoms worsen or if the condition fails to improve as anticipated.   Iran Planas, PA-C

## 2022-06-12 ENCOUNTER — Encounter: Payer: Self-pay | Admitting: Physician Assistant

## 2022-06-12 DIAGNOSIS — F419 Anxiety disorder, unspecified: Secondary | ICD-10-CM

## 2022-06-12 MED ORDER — CLONAZEPAM 0.5 MG PO TABS: 0.5000 mg | ORAL_TABLET | Freq: Two times a day (BID) | ORAL | 2 refills | Status: DC

## 2022-06-12 NOTE — Addendum Note (Signed)
Addended by: Donella Stade on: 06/12/2022 12:35 PM   Modules accepted: Orders

## 2022-06-12 NOTE — Addendum Note (Signed)
Addended by: Narda Rutherford on: 06/12/2022 11:33 AM   Modules accepted: Orders

## 2022-08-03 ENCOUNTER — Other Ambulatory Visit: Payer: Self-pay | Admitting: Physician Assistant

## 2022-08-03 DIAGNOSIS — F341 Dysthymic disorder: Secondary | ICD-10-CM

## 2022-08-03 DIAGNOSIS — F419 Anxiety disorder, unspecified: Secondary | ICD-10-CM

## 2022-08-14 ENCOUNTER — Telehealth (INDEPENDENT_AMBULATORY_CARE_PROVIDER_SITE_OTHER): Payer: Commercial Managed Care - PPO | Admitting: Physician Assistant

## 2022-08-14 ENCOUNTER — Encounter: Payer: Self-pay | Admitting: Physician Assistant

## 2022-08-14 VITALS — BP 132/82 | Ht 63.0 in | Wt 185.0 lb

## 2022-08-14 DIAGNOSIS — F909 Attention-deficit hyperactivity disorder, unspecified type: Secondary | ICD-10-CM | POA: Diagnosis not present

## 2022-08-14 DIAGNOSIS — F419 Anxiety disorder, unspecified: Secondary | ICD-10-CM | POA: Diagnosis not present

## 2022-08-14 DIAGNOSIS — F341 Dysthymic disorder: Secondary | ICD-10-CM | POA: Diagnosis not present

## 2022-08-14 MED ORDER — AMPHETAMINE-DEXTROAMPHETAMINE 20 MG PO TABS: 20.0000 mg | ORAL_TABLET | Freq: Two times a day (BID) | ORAL | 0 refills | Status: DC

## 2022-08-14 NOTE — Progress Notes (Signed)
..Virtual Visit via Video Note  I connected with Melanie Fritz on 08/14/22 at  8:30 AM EDT by a video enabled telemedicine application and verified that I am speaking with the correct person using two identifiers.  Location: Patient: work Provider: clinic  .Marland KitchenParticipating in visit:  Patient: Melanie Fritz Provider: Tandy Gaw PA-C   I discussed the limitations of evaluation and management by telemedicine and the availability of in person appointments. The patient expressed understanding and agreed to proceed.  History of Present Illness: Pt calls in for medication refills.   Pt is doing well on adderall and lexapro. She takes klonapin 1/2 tablet twice a day. No concerns. She has a baby at home and not getting a lot of rest at night. She is very active and work going well.   .. Active Ambulatory Problems    Diagnosis Date Noted   Anxiety 04/13/2012   History of Reoccuring BV 05/03/2012   Bacterial vaginosis 05/18/2012   Depression 06/06/2012   Sprain of ankle, right 09/05/2012   Adult ADHD 07/13/2014   Left breast mass 05/09/2015   Muscle spasm 02/23/2017   Trouble in sleeping 05/03/2018   Muscle twitching 05/03/2018   Weight gain 02/27/2019   Overweight 02/27/2019   Recurrent cold sores 05/30/2019   Resolved Ambulatory Problems    Diagnosis Date Noted   No Resolved Ambulatory Problems   No Additional Past Medical History      Observations/Objective: No acute distress Normal mood and appearance  .Marland Kitchen    08/14/2022    8:22 AM 06/03/2022    7:20 AM 03/06/2022    8:35 AM 09/09/2020    9:27 AM 05/31/2020    9:28 AM  Depression screen PHQ 2/9  Decreased Interest 1 0 1 0 0  Down, Depressed, Hopeless 0 0 1 0 0  PHQ - 2 Score 1 0 2 0 0  Altered sleeping 0 0 0    Tired, decreased energy 1 0 1    Change in appetite 1 0 0    Feeling bad or failure about yourself  0 0 0    Trouble concentrating 0 0 0    Moving slowly or fidgety/restless 0 0 1    Suicidal thoughts 0 0 0     PHQ-9 Score 3 0 4    Difficult doing work/chores Not difficult at all Not difficult at all Somewhat difficult     .Marland Kitchen    08/14/2022    8:24 AM 06/03/2022    7:22 AM 03/06/2022    8:37 AM 02/27/2019    9:39 AM  GAD 7 : Generalized Anxiety Score  Nervous, Anxious, on Edge 0  Control/stop worrying Worry too much - different things 0 1 1 0  Trouble relaxing 0 1 1 0  Restless 0 0 0 0  Easily annoyed or irritable 0 0 1 0  Afraid - awful might happen 0 0 0 0  Total GAD 7 Score Anxiety Difficulty Not difficult at all Somewhat difficult Somewhat difficult Not difficult at all       Assessment and Plan: Marland KitchenMarland KitchenBrennah was seen today for adhd.  Diagnoses and all orders for this visit:  Adult ADHD  Anxiety  Dysthymia   PHQ/GAD stable and look good Continue on lexapro, does not need refills. Continue to try to decrease klonapin to as needed .Marland KitchenPDMP reviewed during this encounter.  Refilled adderall for 3 months  Will schedule for pap in  office.   Follow Up Instructions:    I discussed the assessment and treatment plan with the patient. The patient was provided an opportunity to ask questions and all were answered. The patient agreed with the plan and demonstrated an understanding of the instructions.   The patient was advised to call back or seek an in-person evaluation if the symptoms worsen or if the condition fails to improve as anticipated.   Tandy Gaw, PA-C

## 2022-08-14 NOTE — Progress Notes (Signed)
Meds ordered this encounter  Medications   amphetamine-dextroamphetamine (ADDERALL) 20 MG tablet    Sig: Take 1 tablet (20 mg total) by mouth 2 (two) times daily.    Dispense:  60 tablet    Refill:  0    Order Specific Question:   Supervising Provider    Answer:   Nani Gasser D [[id number]]   amphetamine-dextroamphetamine (ADDERALL) 20 MG tablet    Sig: Take 1 tablet (20 mg total) by mouth 2 (two) times daily.    Dispense:  60 tablet    Refill:  0    Order Specific Question:   Supervising Provider    Answer:   Nani Gasser D [[id number]]   amphetamine-dextroamphetamine (ADDERALL) 20 MG tablet    Sig: Take 1 tablet (20 mg total) by mouth 2 (two) times daily.    Dispense:  60 tablet    Refill:  0    Order Specific Question:   Supervising Provider    Answer:   Nani Gasser D [[id number]]

## 2022-08-14 NOTE — Progress Notes (Signed)
Pt reports that she is doing well on current dosage.   She is currently taking 1/2 tab of the Clonazepam    Pt was advised to call back to schedule an appointment to get her Pap smear done.

## 2022-09-06 ENCOUNTER — Encounter: Payer: Self-pay | Admitting: Physician Assistant

## 2022-09-06 DIAGNOSIS — F419 Anxiety disorder, unspecified: Secondary | ICD-10-CM

## 2022-09-07 MED ORDER — CLONAZEPAM 0.5 MG PO TABS: 0.5000 mg | ORAL_TABLET | Freq: Two times a day (BID) | ORAL | 2 refills | Status: DC

## 2022-10-28 ENCOUNTER — Telehealth: Payer: Commercial Managed Care - PPO | Admitting: Physician Assistant

## 2022-11-04 ENCOUNTER — Telehealth (HOSPITAL_BASED_OUTPATIENT_CLINIC_OR_DEPARTMENT_OTHER): Payer: Self-pay | Admitting: Physician Assistant

## 2022-11-04 NOTE — Telephone Encounter (Signed)
Refill request received from the pharmacy for clonazepam 0.5mg  tablet with instructions to take 1/2 by mouth three times daily as needed, quantity 45.   Jade, please advise if you are okay refilling med for pt.

## 2022-11-05 NOTE — Telephone Encounter (Signed)
I called the pharmacy and they state she picked up the prescription on 11/03/22. I called and left a message for patient to return call.

## 2022-11-09 ENCOUNTER — Ambulatory Visit (INDEPENDENT_AMBULATORY_CARE_PROVIDER_SITE_OTHER): Payer: Commercial Managed Care - PPO | Admitting: Physician Assistant

## 2022-11-09 ENCOUNTER — Other Ambulatory Visit (HOSPITAL_COMMUNITY)
Admission: RE | Admit: 2022-11-09 | Discharge: 2022-11-09 | Disposition: A | Discharge status: Home / Self Care | Payer: Commercial Managed Care - PPO | Source: Ambulatory Visit | Attending: Physician Assistant | Admitting: Physician Assistant

## 2022-11-09 VITALS — BP 126/89 | HR 82 | Ht 63.0 in | Wt 207.0 lb

## 2022-11-09 DIAGNOSIS — Z124 Encounter for screening for malignant neoplasm of cervix: Secondary | ICD-10-CM

## 2022-11-09 DIAGNOSIS — Z131 Encounter for screening for diabetes mellitus: Secondary | ICD-10-CM

## 2022-11-09 DIAGNOSIS — F909 Attention-deficit hyperactivity disorder, unspecified type: Secondary | ICD-10-CM | POA: Diagnosis not present

## 2022-11-09 DIAGNOSIS — Z6836 Body mass index (BMI) 36.0-36.9, adult: Secondary | ICD-10-CM

## 2022-11-09 DIAGNOSIS — R635 Abnormal weight gain: Secondary | ICD-10-CM

## 2022-11-09 DIAGNOSIS — Z13220 Encounter for screening for lipoid disorders: Secondary | ICD-10-CM

## 2022-11-09 DIAGNOSIS — Z0000 Encounter for general adult medical examination without abnormal findings: Secondary | ICD-10-CM

## 2022-11-09 DIAGNOSIS — E6609 Other obesity due to excess calories: Secondary | ICD-10-CM

## 2022-11-09 LAB — CBC WITH DIFFERENTIAL/PLATELET
Basophils Absolute: 22 cells/uL (ref 0–200)
Basophils Relative: 0.4 %
Hemoglobin: 13.5 g/dL (ref 11.7–15.5)
Lymphs Abs: 1036 cells/uL (ref 850–3900)
MCH: 31.6 pg (ref 27.0–33.0)
MCHC: 32.9 g/dL (ref 32.0–36.0)
Monocytes Relative: 9.4 %
RDW: 12.7 % (ref 11.0–15.0)
Total Lymphocyte: 18.5 %
WBC: 5.6 10*3/uL (ref 3.8–10.8)

## 2022-11-09 MED ORDER — AMPHETAMINE-DEXTROAMPHETAMINE 20 MG PO TABS: 20.0000 mg | ORAL_TABLET | Freq: Two times a day (BID) | ORAL | 0 refills | Status: DC

## 2022-11-09 NOTE — Patient Instructions (Addendum)
Contrave Qsymia Wegovy mounjaro  Health Maintenance, Female Adopting a healthy lifestyle and getting preventive care are important in promoting health and wellness. Ask your health care provider about: The right schedule for you to have regular tests and exams. Things you can do on your own to prevent diseases and keep yourself healthy. What should I know about diet, weight, and exercise? Eat a healthy diet  Eat a diet that includes plenty of vegetables, fruits, low-fat dairy products, and lean protein. Do not eat a lot of foods that are high in solid fats, added sugars, or sodium. Maintain a healthy weight Body mass index (BMI) is used to identify weight problems. It estimates body fat based on height and weight. Your health care provider can help determine your BMI and help you achieve or maintain a healthy weight. Get regular exercise Get regular exercise. This is one of the most important things you can do for your health. Most adults should: Exercise for at least 150 minutes each week. The exercise should increase your heart rate and make you sweat (moderate-intensity exercise). Do strengthening exercises at least twice a week. This is in addition to the moderate-intensity exercise. Spend less time sitting. Even light physical activity can be beneficial. Watch cholesterol and blood lipids Have your blood tested for lipids and cholesterol at 36 years of age, then have this test every 5 years. Have your cholesterol levels checked more often if: Your lipid or cholesterol levels are high. You are older than 36 years of age You are at high risk for heart disease. What should I know about cancer screening? Depending on your health history and family history, you may need to have cancer screening at various ages. This may include screening for: Breast cancer. Cervical cancer. Colorectal cancer. Skin cancer. Lung cancer. What should I know about heart disease, diabetes, and high blood  pressure? Blood pressure and heart disease High blood pressure causes heart disease and increases the risk of stroke. This is more likely to develop in people who have high blood pressure readings or are overweight. Have your blood pressure checked: Every 3-5 years if you are 74-36 years of age. Every year if you are 36 years old or older. Diabetes Have regular diabetes screenings. This checks your fasting blood sugar level. Have the screening done: Once every three years after age 36 if you are at a normal weight and have a low risk for diabetes. More often and at a younger age if you are overweight or have a high risk for diabetes. What should I know about preventing infection? Hepatitis B If you have a higher risk for hepatitis B, you should be screened for this virus. Talk with your health care provider to find out if you are at risk for hepatitis B infection. Hepatitis C Testing is recommended for: Everyone born from [birth year] through 1965. Anyone with known risk factors for hepatitis C. Sexually transmitted infections (STIs) Get screened for STIs, including gonorrhea and chlamydia, if: You are sexually active and are younger than 36 years of age. You are older than 36 years of age and your health care provider tells you that you are at risk for this type of infection. Your sexual activity has changed since you were last screened, and you are at increased risk for chlamydia or gonorrhea. Ask your health care provider if you are at risk. Ask your health care provider about whether you are at high risk for HIV. Your health care provider may recommend a prescription  medicine to help prevent HIV infection. If you choose to take medicine to prevent HIV, you should first get tested for HIV. You should then be tested every 3 months for as long as you are taking the medicine. Pregnancy If you are about to stop having your period (premenopausal) and you may become pregnant, seek counseling before you  get pregnant. Take 400 to 800 micrograms (mcg) of folic acid every day if you become pregnant. Ask for birth control (contraception) if you want to prevent pregnancy. Osteoporosis and menopause Osteoporosis is a disease in which the bones lose minerals and strength with aging. This can result in bone fractures. If you are 36 years old or older, or if you are at risk for osteoporosis and fractures, ask your health care provider if you should: Be screened for bone loss. Take a calcium or vitamin D supplement to lower your risk of fractures. Be given hormone replacement therapy (HRT) to treat symptoms of menopause. Follow these instructions at home: Alcohol use Do not drink alcohol if: Your health care provider tells you not to drink. You are pregnant, may be pregnant, or are planning to become pregnant. If you drink alcohol: Limit how much you have to: 0-1 drink a day. Know how much alcohol is in your drink. In the [place] one drink equals one 12 oz bottle of beer (355 mL), one 5 oz glass of wine (148 mL), or one 1 oz glass of hard liquor (44 mL). Lifestyle Do not use any products that contain nicotine or tobacco. These products include cigarettes, chewing tobacco, and vaping devices, such as e-cigarettes. If you need help quitting, ask your health care provider. Do not use street drugs. Do not share needles. Ask your health care provider for help if you need support or information about quitting drugs. General instructions Schedule regular health, dental, and eye exams. Stay current with your vaccines. Tell your health care provider if: You often feel depressed. You have ever been abused or do not feel safe at home. Summary Adopting a healthy lifestyle and getting preventive care are important in promoting health and wellness. Follow your health care provider's instructions about healthy diet, exercising, and getting tested or screened for diseases. Follow your health care provider's  instructions on monitoring your cholesterol and blood pressure. This information is not intended to replace advice given to you by your health care provider. Make sure you discuss any questions you have with your health care provider. Document Revised: 09/02/2020 Document Reviewed: 09/02/2020 Elsevier Patient Education  2024 [employer].

## 2022-11-09 NOTE — Progress Notes (Signed)
Complete physical exam  Patient: Melanie Fritz   DOB: 1985/09/07   36 y.o. Female  MRN: [id number]  Subjective:    Chief Complaint  Patient presents with   Medication Refill   Gynecologic Exam    Melanie Fritz is a 37 y.o. female who presents today for a complete physical exam. She reports consuming a general diet. The patient does not participate in regular exercise at present. She generally feels fairly well. She reports sleeping well. She does have additional problems to discuss today.   She is concerned with weight gain. She does not exercise but does not feel like she eats a lot either. She has a 2 month old at home. No medication changes. She is sleeping well.   Pt is sexually active with female partner.    Most recent fall risk assessment:    11/09/2022    8:27 AM  Fall Risk   Falls in the past year? 0  Number falls in past yr: 0  Injury with Fall? 0  Risk for fall due to : No Fall Risks  Follow up Falls evaluation completed     Most recent depression screenings:    11/09/2022    8:27 AM 08/14/2022    8:22 AM  PHQ 2/9 Scores  PHQ - 2 Score 1 1  PHQ- 9 Score  3    Vision:Within last year and Dental: No current dental problems and No regular dental care   Patient Active Problem List   Diagnosis Date Noted   Class 2 obesity due to excess calories without serious comorbidity with body mass index (BMI) of 36.0 to 36.9 in adult 11/09/2022   Recurrent cold sores 05/30/2019   Weight gain 02/27/2019   Overweight 02/27/2019   Trouble in sleeping 05/03/2018   Muscle twitching 05/03/2018   Muscle spasm 02/23/2017   Left breast mass 05/09/2015   Adult ADHD 07/13/2014   Sprain of ankle, right 09/05/2012   Depression 06/06/2012   Bacterial vaginosis 05/18/2012   History of Reoccuring BV 05/03/2012   Anxiety 04/13/2012   Past Medical History:  Diagnosis Date   Anxiety 04/13/2012   History reviewed. No pertinent surgical history. Family History  Problem Relation  Age of Onset   Diabetes Father    Leukemia Unknown    Heart attack Unknown        grandfather   Hypertension Mother    Stroke Unknown        grandfather   Allergies  Allergen Reactions   Adderall Xr [Amphetamine-Dextroamphet Er]     Only XR but gives her a headache.    Effexor Xr [Venlafaxine Hcl Er]     fatigue   Paroxetine Hcl Other (See Comments)    Headaches, insomnia, nausea   Wellbutrin [Bupropion]     Unease      Patient Care Team: Nolene Ebbs as PCP - General (Family Medicine)   Outpatient Medications Prior to Visit  Medication Sig   clonazePAM (KLONOPIN) 0.5 MG tablet Take 1 tablet (0.5 mg total) by mouth 2 (two) times daily.   escitalopram (LEXAPRO) 10 MG tablet Take 1 tablet (10 mg total) by mouth daily.   [DISCONTINUED] amphetamine-dextroamphetamine (ADDERALL) 20 MG tablet Take 1 tablet (20 mg total) by mouth 2 (two) times daily.   [DISCONTINUED] amphetamine-dextroamphetamine (ADDERALL) 20 MG tablet Take 1 tablet (20 mg total) by mouth 2 (two) times daily.   [DISCONTINUED] amphetamine-dextroamphetamine (ADDERALL) 20 MG tablet Take 1 tablet (20 mg total) by mouth 2 (  two) times daily.   No facility-administered medications prior to visit.    Review of Systems  All other systems reviewed and are negative.         Objective:     BP 126/89 (BP Location: Right Arm, Patient Position: Sitting, Cuff Size: Large)   Pulse 82   Ht 5\' 3"  (1.6 m)   Wt 207 lb (93.9 kg)   SpO2 100%   BMI 36.67 kg/m  BP Readings from Last 3 Encounters:  11/09/22 126/89  08/14/22 132/82  03/06/22 122/80   Wt Readings from Last 3 Encounters:  11/09/22 207 lb (93.9 kg)  08/14/22 185 lb (83.9 kg)  03/06/22 162 lb (73.5 kg)      Physical Exam   BP 126/89 (BP Location: Right Arm, Patient Position: Sitting, Cuff Size: Large)   Pulse 82   Ht 5\' 3"  (1.6 m)   Wt 207 lb (93.9 kg)   SpO2 100%   BMI 36.67 kg/m   General Appearance:    Alert, cooperative, obese no  distress, appears stated age  Head:    Normocephalic, without obvious abnormality, atraumatic  Eyes:    PERRL, conjunctiva/corneas clear, EOM's intact, fundi    benign, both eyes  Ears:    Normal TM's and external ear canals, both ears  Nose:   Nares normal, septum midline, mucosa normal, no drainage    or sinus tenderness  Throat:   Lips, mucosa, and tongue normal; teeth and gums normal  Neck:   Supple, symmetrical, trachea midline, no adenopathy;    thyroid:  no enlargement/tenderness/nodules; no carotid   bruit or JVD  Back:     Symmetric, no curvature, ROM normal, no CVA tenderness  Lungs:     Clear to auscultation bilaterally, respirations unlabored  Chest Wall:    No tenderness or deformity   Heart:    Regular rate and rhythm, S1 and S2 normal, no murmur, rub   or gallop     Abdomen:     Soft, non-tender, bowel sounds active all four quadrants,    no masses, no organomegaly  Genitalia:    Normal female without lesions, white to clear discharge in vaginal canal. Friable cervix.      Extremities:   Extremities normal, atraumatic, no cyanosis or edema  Pulses:   2+ and symmetric all extremities  Skin:   Skin color, texture, turgor normal, no rashes or lesions  Lymph nodes:   Cervical, supraclavicular, and axillary nodes normal  Neurologic:   CNII-XII intact, normal strength, sensation and reflexes    throughout      Assessment & Plan:    Routine Health Maintenance and Physical Exam  Immunization History  Administered Date(s) Administered   Influenza-Unspecified 01/25/2014, 02/26/2015   PFIZER(Purple Top)SARS-COV-2 Vaccination 12/27/2019, 01/24/2020   Td 04/27/2009   Td (Adult), 2 Lf Tetanus Toxid, Preservative Free 04/27/2009   Tdap 08/05/2016    Health Maintenance  Topic Date Due   PAP SMEAR-Modifier  05/08/2018   Hepatitis C Screening  06/04/2023 (Originally 11/26/2004)   COVID-19 Vaccine (3 - Pfizer risk series) 08/30/2023 (Originally 02/21/2020)   INFLUENZA VACCINE   11/26/2022   DTaP/Tdap/Td (3 - Td or Tdap) 08/06/2026   HIV Screening  Completed   HPV VACCINES  Aged Out    Discussed health benefits of physical activity, and encouraged her to engage in regular exercise appropriate for her age and condition.  Marland KitchenMarchelle Fritz was seen today for medication refill and gynecologic exam.  Diagnoses and all orders for  this visit:  Routine Papanicolaou smear -     Cytology - PAP  Adult ADHD -     amphetamine-dextroamphetamine (ADDERALL) 20 MG tablet; Take 1 tablet (20 mg total) by mouth 2 (two) times daily. -     amphetamine-dextroamphetamine (ADDERALL) 20 MG tablet; Take 1 tablet (20 mg total) by mouth 2 (two) times daily. -     amphetamine-dextroamphetamine (ADDERALL) 20 MG tablet; Take 1 tablet (20 mg total) by mouth 2 (two) times daily.  Routine physical examination -     Lipid Panel w/reflex Direct LDL -     COMPLETE METABOLIC PANEL WITH GFR -     TSH -     CBC w/Diff/Platelet  Abnormal weight gain -     COMPLETE METABOLIC PANEL WITH GFR -     TSH -     CBC w/Diff/Platelet  Screening for lipid disorders -     Lipid Panel w/reflex Direct LDL  Screening for diabetes mellitus -     COMPLETE METABOLIC PANEL WITH GFR  Class 2 obesity due to excess calories without serious comorbidity with body mass index (BMI) of 36.0 to 36.9 in adult   .Marland Kitchen Discussed 150 minutes of exercise a week.  Encouraged vitamin D 1000 units and Calcium 1300mg  or 4 servings of dairy a day.  PHQ no concerns Fasting labs ordered Pap done today  .Marland KitchenDiscussed low carb diet with 1500 calories and 80g of protein.  Exercising at least 150 minutes a week.  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  Will check TSH  Discussed medications consider if not improving with diet and exercise  Adderall refilled for ADHD  Return in about 3 months (around 02/09/2023).     Tandy Gaw, PA-C

## 2022-11-10 ENCOUNTER — Other Ambulatory Visit: Payer: Self-pay | Admitting: Physician Assistant

## 2022-11-10 DIAGNOSIS — E781 Pure hyperglyceridemia: Secondary | ICD-10-CM | POA: Insufficient documentation

## 2022-11-10 DIAGNOSIS — F419 Anxiety disorder, unspecified: Secondary | ICD-10-CM

## 2022-11-10 LAB — CBC WITH DIFFERENTIAL/PLATELET
Absolute Monocytes: 526 cells/uL (ref 200–950)
Eosinophils Absolute: 90 cells/uL (ref 15–500)
Eosinophils Relative: 1.6 %
HCT: 41 % (ref 35.0–45.0)
MCV: 96 fL (ref 80.0–100.0)
MPV: 9.2 fL (ref 7.5–12.5)
Neutro Abs: 3926 cells/uL (ref 1500–7800)
Neutrophils Relative %: 70.1 %
Platelets: 267 10*3/uL (ref 140–400)
RBC: 4.27 10*6/uL (ref 3.80–5.10)

## 2022-11-10 LAB — COMPLETE METABOLIC PANEL WITH GFR
AG Ratio: 1.5 (calc) (ref 1.0–2.5)
ALT: 16 U/L (ref 6–29)
AST: 15 U/L (ref 10–30)
Albumin: 4.3 g/dL (ref 3.6–5.1)
Alkaline phosphatase (APISO): 88 U/L (ref 31–125)
BUN: 11 mg/dL (ref 7–25)
CO2: 29 mmol/L (ref 20–32)
Calcium: 9.7 mg/dL (ref 8.6–10.2)
Chloride: 101 mmol/L (ref 98–110)
Creat: 0.66 mg/dL (ref 0.50–0.97)
Globulin: 2.9 g/dL (calc) (ref 1.9–3.7)
Glucose, Bld: 94 mg/dL (ref 65–99)
Potassium: 4.3 mmol/L (ref 3.5–5.3)
Sodium: 137 mmol/L (ref 135–146)
Total Bilirubin: 0.5 mg/dL (ref 0.2–1.2)
Total Protein: 7.2 g/dL (ref 6.1–8.1)
eGFR: 117 mL/min/{1.73_m2} (ref >=60)

## 2022-11-10 LAB — LIPID PANEL W/REFLEX DIRECT LDL
Cholesterol: 187 mg/dL (ref <200)
HDL: 83 mg/dL (ref >=50)
LDL Cholesterol (Calc): 71 mg/dL (calc)
Non-HDL Cholesterol (Calc): 104 mg/dL (calc) (ref <130)
Total CHOL/HDL Ratio: 2.3 (calc) (ref <5.0)
Triglycerides: 236 mg/dL — ABNORMAL HIGH (ref <150)

## 2022-11-10 LAB — TSH: TSH: 2.03 mIU/L

## 2022-11-10 MED ORDER — ESCITALOPRAM OXALATE 10 MG PO TABS
10.0000 mg | ORAL_TABLET | Freq: Every day | ORAL | 3 refills | Status: DC
Start: 2022-11-10 — End: 2023-04-02

## 2022-11-10 NOTE — Progress Notes (Signed)
[place] cholesterol looks amazing.  LDL looks great.  Your TG are up some. Watch sugars and carbs in diet and work to get 150 minutes of exercise a week. Consider starting an OTC fish oil.  Kidney, liver, glucose looks great.  Thyroid normal.  Normal hemoglobin and WBC.

## 2022-11-11 ENCOUNTER — Other Ambulatory Visit: Payer: Self-pay | Admitting: Physician Assistant

## 2022-11-11 LAB — CYTOLOGY - PAP
Adequacy: ABSENT
Chlamydia: NEGATIVE
Comment: NEGATIVE
Comment: NEGATIVE
Comment: NEGATIVE
Comment: NORMAL
Diagnosis: REACTIVE
High risk HPV: NEGATIVE
Neisseria Gonorrhea: NEGATIVE
Trichomonas: NEGATIVE

## 2022-11-11 MED ORDER — FLUCONAZOLE 150 MG PO TABS: 150.0000 mg | ORAL_TABLET | Freq: Once | ORAL | 0 refills | Status: AC

## 2022-11-11 NOTE — Progress Notes (Signed)
No abnormal cells. No STDs. Yeast present. Sent dilfucan to treat.

## 2022-12-09 ENCOUNTER — Encounter: Payer: Self-pay | Admitting: Physician Assistant

## 2022-12-09 DIAGNOSIS — F909 Attention-deficit hyperactivity disorder, unspecified type: Secondary | ICD-10-CM

## 2022-12-09 DIAGNOSIS — F419 Anxiety disorder, unspecified: Secondary | ICD-10-CM

## 2022-12-09 MED ORDER — CLONAZEPAM 0.5 MG PO TABS
0.5000 mg | ORAL_TABLET | Freq: Two times a day (BID) | ORAL | 2 refills | Status: DC
Start: 2022-12-09 — End: 2023-07-09

## 2022-12-09 MED ORDER — AMPHETAMINE-DEXTROAMPHETAMINE 20 MG PO TABS
20.0000 mg | ORAL_TABLET | Freq: Three times a day (TID) | ORAL | 0 refills | Status: DC
Start: 2023-01-10 — End: 2022-12-30

## 2022-12-14 MED ORDER — AMPHETAMINE-DEXTROAMPHETAMINE 20 MG PO TABS
20.0000 mg | ORAL_TABLET | Freq: Two times a day (BID) | ORAL | 0 refills | Status: DC
Start: 2022-12-14 — End: 2022-12-30

## 2022-12-14 NOTE — Addendum Note (Signed)
Addended by: Jomarie Longs on: 12/14/2022 02:33 PM   Modules accepted: Orders

## 2022-12-14 NOTE — Addendum Note (Signed)
Addended by: Chalmers Cater on: 12/14/2022 01:35 PM   Modules accepted: Orders

## 2022-12-30 ENCOUNTER — Encounter: Payer: Self-pay | Admitting: Physician Assistant

## 2022-12-30 ENCOUNTER — Ambulatory Visit: Payer: Commercial Managed Care - PPO | Admitting: Physician Assistant

## 2022-12-30 VITALS — BP 137/84 | HR 94 | Ht 63.0 in | Wt 206.0 lb

## 2022-12-30 DIAGNOSIS — F3342 Major depressive disorder, recurrent, in full remission: Secondary | ICD-10-CM

## 2022-12-30 DIAGNOSIS — J3489 Other specified disorders of nose and nasal sinuses: Secondary | ICD-10-CM | POA: Diagnosis not present

## 2022-12-30 DIAGNOSIS — F909 Attention-deficit hyperactivity disorder, unspecified type: Secondary | ICD-10-CM | POA: Diagnosis not present

## 2022-12-30 DIAGNOSIS — B001 Herpesviral vesicular dermatitis: Secondary | ICD-10-CM

## 2022-12-30 DIAGNOSIS — F419 Anxiety disorder, unspecified: Secondary | ICD-10-CM | POA: Diagnosis not present

## 2022-12-30 DIAGNOSIS — Z23 Encounter for immunization: Secondary | ICD-10-CM

## 2022-12-30 MED ORDER — AMPHETAMINE-DEXTROAMPHETAMINE 20 MG PO TABS
20.0000 mg | ORAL_TABLET | Freq: Three times a day (TID) | ORAL | 0 refills | Status: DC
Start: 2022-12-30 — End: 2023-04-02

## 2022-12-30 MED ORDER — VALACYCLOVIR HCL 1 G PO TABS
2000.0000 mg | ORAL_TABLET | Freq: Two times a day (BID) | ORAL | 1 refills | Status: AC
Start: 2022-12-30 — End: 2022-12-31

## 2022-12-30 MED ORDER — MUPIROCIN 2 % EX OINT
TOPICAL_OINTMENT | CUTANEOUS | 1 refills | Status: AC
Start: 2022-12-30 — End: ?

## 2022-12-30 MED ORDER — AMPHETAMINE-DEXTROAMPHETAMINE 20 MG PO TABS
20.0000 mg | ORAL_TABLET | Freq: Three times a day (TID) | ORAL | 0 refills | Status: DC
Start: 2023-02-28 — End: 2023-04-02

## 2022-12-30 MED ORDER — AMPHETAMINE-DEXTROAMPHETAMINE 20 MG PO TABS
20.0000 mg | ORAL_TABLET | Freq: Three times a day (TID) | ORAL | 0 refills | Status: DC
Start: 2023-01-29 — End: 2023-04-02

## 2022-12-30 NOTE — Progress Notes (Signed)
Established Patient Office Visit  Subjective   Patient ID: Melanie Fritz, female    DOB: 06/30/1985  Age: 37 y.o. MRN: [id number]   HPI Pt is a 37 yo female who presents to the clinic to fill out paperwork from her last CPE for adoptions and to get refills.   She is doing great. No concerns. She has switched pharmacies.   She is having some nasal sores from time to time and cold sores around mouth. Would like something to use as needed.    Patient Active Problem List   Diagnosis Date Noted   Nasal vestibulitis 12/30/2022   Hypertriglyceridemia 11/10/2022   Class 2 obesity due to excess calories without serious comorbidity with body mass index (BMI) of 36.0 to 36.9 in adult 11/09/2022   Recurrent cold sores 05/30/2019   Weight gain 02/27/2019   Overweight 02/27/2019   Trouble in sleeping 05/03/2018   Muscle twitching 05/03/2018   Muscle spasm 02/23/2017   Left breast mass 05/09/2015   Adult ADHD 07/13/2014   Sprain of ankle, right 09/05/2012   Depression 06/06/2012   Bacterial vaginosis 05/18/2012   History of Reoccuring BV 05/03/2012   Anxiety 04/13/2012   Past Medical History:  Diagnosis Date   Anxiety 04/13/2012   No past surgical history on file. Family History  Problem Relation Age of Onset   Diabetes Father    Leukemia Unknown    Heart attack Unknown        grandfather   Hypertension Mother    Stroke Unknown        grandfather   Allergies  Allergen Reactions   Adderall Xr [Amphetamine-Dextroamphet Er]     Only XR but gives her a headache.    Effexor Xr [Venlafaxine Hcl Er]     fatigue   Paroxetine Hcl Other (See Comments)    Headaches, insomnia, nausea   Wellbutrin [Bupropion]     Unease      ROS    Objective:     BP 137/84   Pulse 94   Ht 5\' 3"  (1.6 m)   Wt 206 lb (93.4 kg)   SpO2 99%   BMI 36.49 kg/m  BP Readings from Last 3 Encounters:  12/30/22 137/84  11/09/22 126/89  08/14/22 132/82   Wt Readings from Last 3 Encounters:   12/30/22 206 lb (93.4 kg)  11/09/22 207 lb (93.9 kg)  08/14/22 185 lb (83.9 kg)      Physical Exam Constitutional:      Appearance: Normal appearance.  HENT:     Head: Normocephalic.  Cardiovascular:     Rate and Rhythm: Normal rate and regular rhythm.     Heart sounds: Normal heart sounds.  Pulmonary:     Effort: Pulmonary effort is normal.     Breath sounds: Normal breath sounds.  Neurological:     General: No focal deficit present.     Mental Status: She is alert and oriented to person, place, and time.  Psychiatric:        Mood and Affect: Mood normal.   ..    12/30/2022    8:52 AM 11/09/2022    8:27 AM 08/14/2022    8:22 AM 06/03/2022    7:20 AM 03/06/2022    8:35 AM  Depression screen PHQ 2/9  Decreased Interest 0 1 1 0 1  Down, Depressed, Hopeless 0 0 0 0 1  PHQ - 2 Score 0 1 1 0 2  Altered sleeping 0  0 0 0  Tired, decreased energy 0  1 0 1  Change in appetite 0  1 0 0  Feeling bad or failure about yourself  0  0 0 0  Trouble concentrating 0  0 0 0  Moving slowly or fidgety/restless 0  0 0 1  Suicidal thoughts 0  0 0 0  PHQ-9 Score 0  3 0 4  Difficult doing work/chores Not difficult at all  Not difficult at all Not difficult at all Somewhat difficult   ..    12/30/2022    8:52 AM 08/14/2022    8:24 AM 06/03/2022    7:22 AM 03/06/2022    8:37 AM  GAD 7 : Generalized Anxiety Score  Nervous, Anxious, on Edge 0 1 1 1   Control/stop worrying 0 1 1 1   Worry too much - different things 0 0 1 1  Trouble relaxing 0 0 1 1  Restless 0 0 0 0  Easily annoyed or irritable 0 0 0 1  Afraid - awful might happen 0 0 0 0  Total GAD 7 Score 0 2 4 5   Anxiety Difficulty Not difficult at all Not difficult at all Somewhat difficult Somewhat difficult     Last CBC Lab Results  Component Value Date   WBC 5.6 11/09/2022   HGB 13.5 11/09/2022   HCT 41.0 11/09/2022   MCV 96.0 11/09/2022   MCH 31.6 11/09/2022   RDW 12.7 11/09/2022   PLT 267 11/09/2022   Last metabolic  panel Lab Results  Component Value Date   GLUCOSE 94 11/09/2022   NA 137 11/09/2022   K 4.3 11/09/2022   CL 101 11/09/2022   CO2 29 11/09/2022   BUN 11 11/09/2022   CREATININE 0.66 11/09/2022   EGFR 117 11/09/2022   CALCIUM 9.7 11/09/2022   PROT 7.2 11/09/2022   ALBUMIN 4.3 08/05/2016   BILITOT 0.5 11/09/2022   ALKPHOS 49 08/05/2016   AST 15 11/09/2022   ALT 16 11/09/2022   Last lipids Lab Results  Component Value Date   CHOL 187 11/09/2022   HDL 83 11/09/2022   LDLCALC 71 11/09/2022   TRIG 236 (H) 11/09/2022   CHOLHDL 2.3 11/09/2022   Last hemoglobin A1c No results found for: "HGBA1C" Last thyroid functions Lab Results  Component Value Date   TSH 2.03 11/09/2022      The ASCVD Risk score (Arnett DK, et al., 2019) failed to calculate for the following reasons:   The 2019 ASCVD risk score is only valid for ages 22 to 45    Assessment & Plan:  [name] was seen today for annual exam.  Diagnoses and all orders for this visit:  Recurrent major depressive disorder, in full remission (HCC)  Adult ADHD -     amphetamine-dextroamphetamine (ADDERALL) 20 MG tablet; Take 1 tablet (20 mg total) by mouth 3 (three) times daily. -     amphetamine-dextroamphetamine (ADDERALL) 20 MG tablet; Take 1 tablet (20 mg total) by mouth 3 (three) times daily. -     amphetamine-dextroamphetamine (ADDERALL) 20 MG tablet; Take 1 tablet (20 mg total) by mouth 3 (three) times daily.  Anxiety  Nasal vestibulitis -     mupirocin ointment (BACTROBAN) 2 %; Apply to affected area TID for 7 days.  Recurrent cold sores -     valACYclovir (VALTREX) 1000 MG tablet; Take 2 tablets (2,000 mg total) by mouth 2 (two) times daily for 1 day. As needed for cold sore outbreak   Paperwork for adoptions filled out.  Adderall  refilled for 3 months Valtrex as needed for cold sores Bactroban as needed for nasal sores Follow up in 3 months.    Tandy Gaw, PA-C

## 2023-02-09 ENCOUNTER — Ambulatory Visit: Payer: Commercial Managed Care - PPO | Admitting: Physician Assistant

## 2023-03-09 ENCOUNTER — Telehealth: Payer: Commercial Managed Care - PPO | Admitting: Physician Assistant

## 2023-04-02 ENCOUNTER — Telehealth (INDEPENDENT_AMBULATORY_CARE_PROVIDER_SITE_OTHER): Payer: Commercial Managed Care - PPO | Admitting: Physician Assistant

## 2023-04-02 DIAGNOSIS — F909 Attention-deficit hyperactivity disorder, unspecified type: Secondary | ICD-10-CM

## 2023-04-02 DIAGNOSIS — F419 Anxiety disorder, unspecified: Secondary | ICD-10-CM | POA: Diagnosis not present

## 2023-04-02 DIAGNOSIS — F3342 Major depressive disorder, recurrent, in full remission: Secondary | ICD-10-CM

## 2023-04-02 MED ORDER — AMPHETAMINE-DEXTROAMPHETAMINE 20 MG PO TABS
20.0000 mg | ORAL_TABLET | Freq: Three times a day (TID) | ORAL | 0 refills | Status: DC
Start: 2023-05-02 — End: 2023-06-30

## 2023-04-02 MED ORDER — AMPHETAMINE-DEXTROAMPHETAMINE 20 MG PO TABS
20.0000 mg | ORAL_TABLET | Freq: Three times a day (TID) | ORAL | 0 refills | Status: DC
Start: 2023-06-01 — End: 2023-06-30

## 2023-04-02 MED ORDER — AMPHETAMINE-DEXTROAMPHETAMINE 20 MG PO TABS
20.0000 mg | ORAL_TABLET | Freq: Three times a day (TID) | ORAL | 0 refills | Status: DC
Start: 2023-04-02 — End: 2023-06-30

## 2023-04-02 NOTE — Progress Notes (Signed)
..Virtual Visit via Video Note  I connected with Melanie Fritz on 04/02/23 at  8:50 AM EST by a video enabled telemedicine application and verified that I am speaking with the correct person using two identifiers.  Location: Patient: home Provider: clinic  .Marland KitchenParticipating in visit:  Patient: Melanie Fritz Provider: Tandy Gaw PA-C Provider in training: Carlyle Dolly PA-S   I discussed the limitations of evaluation and management by telemedicine and the availability of in person appointments. The patient expressed understanding and agreed to proceed.  History of Present Illness: Pt is a 37 yo female who follows up for medication refills.   She has stopped her lexapro and doing well. She continues on adderall. No concerns. Sleeping well. Mood controlled.   .. Active Ambulatory Problems    Diagnosis Date Noted   Anxiety 04/13/2012   History of Reoccuring BV 05/03/2012   Bacterial vaginosis 05/18/2012   Depression 06/06/2012   Sprain of ankle, right 09/05/2012   Adult ADHD 07/13/2014   Left breast mass 05/09/2015   Muscle spasm 02/23/2017   Trouble in sleeping 05/03/2018   Muscle twitching 05/03/2018   Weight gain 02/27/2019   Overweight 02/27/2019   Recurrent cold sores 05/30/2019   Class 2 obesity due to excess calories without serious comorbidity with body mass index (BMI) of 36.0 to 36.9 in adult 11/09/2022   Hypertriglyceridemia 11/10/2022   Nasal vestibulitis 12/30/2022   Resolved Ambulatory Problems    Diagnosis Date Noted   No Resolved Ambulatory Problems   No Additional Past Medical History       Observations/Objective: No acute distress Normal mood and appearance  .Marland Kitchen    04/02/2023    8:44 AM 12/30/2022    8:52 AM 11/09/2022    8:27 AM 08/14/2022    8:22 AM 06/03/2022    7:20 AM  Depression screen PHQ 2/9  Decreased Interest 0 0 1 1 0  Down, Depressed, Hopeless 0 0 0 0 0  PHQ - 2 Score 0 0 1 1 0  Altered sleeping 0 0  0 0  Tired, decreased energy 0 0  1 0   Change in appetite 0 0  1 0  Feeling bad or failure about yourself  0 0  0 0  Trouble concentrating 0 0  0 0  Moving slowly or fidgety/restless  0  0 0  Suicidal thoughts 0 0  0 0  PHQ-9 Score 0 0  3 0  Difficult doing work/chores Not difficult at all Not difficult at all  Not difficult at all Not difficult at all   .Marland Kitchen    04/02/2023    8:45 AM 12/30/2022    8:52 AM 08/14/2022    8:24 AM 06/03/2022    7:22 AM  GAD 7 : Generalized Anxiety Score  Nervous, Anxious, on Edge 0 0 1 1  Control/stop worrying  0 1 1  Worry too much - different things 0 0 0 1  Trouble relaxing  0 0 1  Restless 0 0 0 0  Easily annoyed or irritable 0 0 0 0  Afraid - awful might happen 0 0 0 0  Total GAD 7 Score  0 2 4  Anxiety Difficulty Not difficult at all Not difficult at all Not difficult at all Somewhat difficult     Assessment and Plan: Marland KitchenMarland KitchenCrislynn was seen today for medical management of chronic issues.  Diagnoses and all orders for this visit:  Anxiety  Adult ADHD -     amphetamine-dextroamphetamine (ADDERALL) 20 MG tablet;  Take 1 tablet (20 mg total) by mouth 3 (three) times daily. -     amphetamine-dextroamphetamine (ADDERALL) 20 MG tablet; Take 1 tablet (20 mg total) by mouth 3 (three) times daily. -     amphetamine-dextroamphetamine (ADDERALL) 20 MG tablet; Take 1 tablet (20 mg total) by mouth 3 (three) times daily.  Recurrent major depressive disorder, in full remission (HCC)   Refilled Adderall PHQ/GAD no concerns Follow up in 3 months  Follow Up Instructions:    I discussed the assessment and treatment plan with the patient. The patient was provided an opportunity to ask questions and all were answered. The patient agreed with the plan and demonstrated an understanding of the instructions.   The patient was advised to call back or seek an in-person evaluation if the symptoms worsen or if the condition fails to improve as anticipated.   Tandy Gaw, PA-C

## 2023-06-30 ENCOUNTER — Telehealth: Payer: Commercial Managed Care - PPO | Admitting: Physician Assistant

## 2023-06-30 ENCOUNTER — Encounter: Payer: Self-pay | Admitting: Physician Assistant

## 2023-06-30 VITALS — Ht 63.0 in | Wt 185.0 lb

## 2023-06-30 DIAGNOSIS — F909 Attention-deficit hyperactivity disorder, unspecified type: Secondary | ICD-10-CM

## 2023-06-30 DIAGNOSIS — Z6832 Body mass index (BMI) 32.0-32.9, adult: Secondary | ICD-10-CM

## 2023-06-30 DIAGNOSIS — E66811 Obesity, class 1: Secondary | ICD-10-CM

## 2023-06-30 MED ORDER — AMPHETAMINE-DEXTROAMPHETAMINE 20 MG PO TABS: 20.0000 mg | ORAL_TABLET | Freq: Three times a day (TID) | ORAL | 0 refills | Status: DC

## 2023-06-30 MED ORDER — AMPHETAMINE-DEXTROAMPHETAMINE 20 MG PO TABS
20.0000 mg | ORAL_TABLET | Freq: Three times a day (TID) | ORAL | 0 refills | Status: DC
Start: 2023-06-30 — End: 2023-07-09

## 2023-06-30 NOTE — Progress Notes (Signed)
..  Virtual Visit via Video Note  I connected with Melanie Fritz on 06/30/23 at  7:10 AM EST by a video enabled telemedicine application and verified that I am speaking with the correct person using two identifiers.  Location: Patient: work Provider: clinic  .Marland KitchenParticipating in visit:  Patient: Melanie Fritz Provider: Tandy Gaw PA-C Provider in training: Ilean China PA-S   I discussed the limitations of evaluation and management by telemedicine and the availability of in person appointments. The patient expressed understanding and agreed to proceed.  History of Present Illness: Pt connects to video to discuss medication refills. Pt is doing great. She has great focus and mood. She is eating better and exercising more and losing weight. She is down 25lbs in 3 months. No concerns.     Observations/Objective: No acute distress  Normal mood and appearance  ..    06/30/2023    7:04 AM 04/02/2023    8:44 AM 12/30/2022    8:52 AM 11/09/2022    8:27 AM 08/14/2022    8:22 AM  Depression screen PHQ 2/9  Decreased Interest 0 0 0 1 1  Down, Depressed, Hopeless 0 0 0 0 0  PHQ - 2 Score 0 0 0 1 1  Altered sleeping 0 0 0  0  Tired, decreased energy 0 0 0  1  Change in appetite 0 0 0  1  Feeling bad or failure about yourself  0 0 0  0  Trouble concentrating 0 0 0  0  Moving slowly or fidgety/restless 0  0  0  Suicidal thoughts 0 0 0  0  PHQ-9 Score 0 0 0  3  Difficult doing work/chores Not difficult at all Not difficult at all Not difficult at all  Not difficult at all      Assessment and Plan: Marland KitchenMarland KitchenDiagnoses and all orders for this visit:  Class 1 obesity due to excess calories without serious comorbidity with body mass index (BMI) of 32.0 to 32.9 in adult  Adult ADHD -     amphetamine-dextroamphetamine (ADDERALL) 20 MG tablet; Take 1 tablet (20 mg total) by mouth 3 (three) times daily. -     amphetamine-dextroamphetamine (ADDERALL) 20 MG tablet; Take 1 tablet (20 mg total) by mouth 3  (three) times daily. -     amphetamine-dextroamphetamine (ADDERALL) 20 MG tablet; Take 1 tablet (20 mg total) by mouth 3 (three) times daily.   Refilled for 3 months Great job on weight loss continue with goal of BMI under 30.    Follow Up Instructions:    I discussed the assessment and treatment plan with the patient. The patient was provided an opportunity to ask questions and all were answered. The patient agreed with the plan and demonstrated an understanding of the instructions.   The patient was advised to call back or seek an in-person evaluation if the symptoms worsen or if the condition fails to improve as anticipated.   Tandy Gaw, PA-C

## 2023-07-08 ENCOUNTER — Encounter: Payer: Self-pay | Admitting: Physician Assistant

## 2023-07-08 DIAGNOSIS — F419 Anxiety disorder, unspecified: Secondary | ICD-10-CM

## 2023-07-08 DIAGNOSIS — F909 Attention-deficit hyperactivity disorder, unspecified type: Secondary | ICD-10-CM

## 2023-07-09 MED ORDER — CLONAZEPAM 0.5 MG PO TABS: 0.5000 mg | ORAL_TABLET | Freq: Two times a day (BID) | ORAL | 2 refills | Status: DC

## 2023-07-09 MED ORDER — AMPHETAMINE-DEXTROAMPHETAMINE 20 MG PO TABS: 20.0000 mg | ORAL_TABLET | Freq: Three times a day (TID) | ORAL | 0 refills | Status: DC

## 2023-10-06 ENCOUNTER — Telehealth (INDEPENDENT_AMBULATORY_CARE_PROVIDER_SITE_OTHER): Admitting: Physician Assistant

## 2023-10-06 ENCOUNTER — Encounter: Payer: Self-pay | Admitting: Physician Assistant

## 2023-10-06 DIAGNOSIS — F909 Attention-deficit hyperactivity disorder, unspecified type: Secondary | ICD-10-CM | POA: Diagnosis not present

## 2023-10-06 DIAGNOSIS — F419 Anxiety disorder, unspecified: Secondary | ICD-10-CM

## 2023-10-06 MED ORDER — AMPHETAMINE-DEXTROAMPHETAMINE 20 MG PO TABS: 20.0000 mg | ORAL_TABLET | Freq: Three times a day (TID) | ORAL | 0 refills | Status: DC

## 2023-10-06 MED ORDER — CLONAZEPAM 0.5 MG PO TABS: 0.5000 mg | ORAL_TABLET | Freq: Two times a day (BID) | ORAL | 2 refills | Status: DC

## 2023-10-06 NOTE — Progress Notes (Signed)
..  Virtual Visit via Video Note  I connected with Melanie Fritz on 10/06/23 at  7:10 AM EDT by a video enabled telemedicine application and verified that I am speaking with the correct person using two identifiers.  Location: Patient: work Provider: clinic  .Aaron AasParticipating in visit:  Patient: Melanie Fritz Provider: Sandy Crumb PA-C Provider in training: Maylene Spear PA-S   I discussed the limitations of evaluation and management by telemedicine and the availability of in person appointments. The patient expressed understanding and agreed to proceed.  History of Present Illness: Pt is a 38 yo female who calls into the clinic for refills on adderall and klonapin. She is doing great. No concerns. She is sleeping well. She does take klonapin once a day in the morning right now but plans on trying to cut back in near future.     Observations/Objective: Normal mood and appearance Normal breathing  .Aaron Aas Today's Vitals   10/06/23 0709  BP: 116/71  Weight: 181 lb (82.1 kg)  Height: 5' 3 (1.6 m)   Body mass index is 32.06 kg/m.   Assessment and Plan: Aaron AasAaron AasDestin was seen today for medical management of chronic issues.  Diagnoses and all orders for this visit:  Adult ADHD -     amphetamine -dextroamphetamine  (ADDERALL) 20 MG tablet; Take 1 tablet (20 mg total) by mouth 3 (three) times daily. -     amphetamine -dextroamphetamine  (ADDERALL) 20 MG tablet; Take 1 tablet (20 mg total) by mouth 3 (three) times daily. -     amphetamine -dextroamphetamine  (ADDERALL) 20 MG tablet; Take 1 tablet (20 mg total) by mouth 3 (three) times daily.  Anxiety -     clonazePAM  (KLONOPIN ) 0.5 MG tablet; Take 1 tablet (0.5 mg total) by mouth 2 (two) times daily.   Refilled adderall and klonapin Follow up in 3 months Needs fasting labs in next 3 months   Follow Up Instructions:    I discussed the assessment and treatment plan with the patient. The patient was provided an opportunity to ask questions and all  were answered. The patient agreed with the plan and demonstrated an understanding of the instructions.   The patient was advised to call back or seek an in-person evaluation if the symptoms worsen or if the condition fails to improve as anticipated.   Gardiner Espana, PA-C

## 2023-11-24 ENCOUNTER — Encounter: Payer: Self-pay | Admitting: Physician Assistant

## 2023-11-24 NOTE — Telephone Encounter (Signed)
 Needs appointment

## 2023-12-29 ENCOUNTER — Ambulatory Visit (INDEPENDENT_AMBULATORY_CARE_PROVIDER_SITE_OTHER): Admitting: Physician Assistant

## 2023-12-29 VITALS — BP 136/76 | HR 76 | Ht 63.0 in | Wt 198.0 lb

## 2023-12-29 DIAGNOSIS — F3342 Major depressive disorder, recurrent, in full remission: Secondary | ICD-10-CM

## 2023-12-29 DIAGNOSIS — F909 Attention-deficit hyperactivity disorder, unspecified type: Secondary | ICD-10-CM

## 2023-12-29 DIAGNOSIS — F419 Anxiety disorder, unspecified: Secondary | ICD-10-CM | POA: Diagnosis not present

## 2023-12-29 MED ORDER — VORTIOXETINE HBR 5 MG PO TABS
5.0000 mg | ORAL_TABLET | Freq: Every day | ORAL | 2 refills | Status: DC
Start: 2023-12-29 — End: 2024-01-12

## 2023-12-29 MED ORDER — BUSPIRONE HCL 7.5 MG PO TABS: 7.5000 mg | ORAL_TABLET | Freq: Two times a day (BID) | ORAL | 2 refills | Status: DC

## 2023-12-29 MED ORDER — CLONAZEPAM 0.5 MG PO TABS: 0.5000 mg | ORAL_TABLET | Freq: Two times a day (BID) | ORAL | 2 refills | Status: DC

## 2023-12-29 MED ORDER — AMPHETAMINE-DEXTROAMPHETAMINE 20 MG PO TABS
20.0000 mg | ORAL_TABLET | Freq: Three times a day (TID) | ORAL | 0 refills | Status: DC
Start: 2024-02-27 — End: 2024-02-02

## 2023-12-29 MED ORDER — AMPHETAMINE-DEXTROAMPHETAMINE 20 MG PO TABS
20.0000 mg | ORAL_TABLET | Freq: Three times a day (TID) | ORAL | 0 refills | Status: DC
Start: 2024-01-28 — End: 2024-02-02

## 2023-12-29 MED ORDER — AMPHETAMINE-DEXTROAMPHETAMINE 20 MG PO TABS: 20.0000 mg | ORAL_TABLET | Freq: Three times a day (TID) | ORAL | 0 refills | Status: DC

## 2023-12-29 NOTE — Patient Instructions (Signed)
 Start buspar  twice a day Start trintellix  once a day Klonapin as needed up to twice a day  Start OTC ashwaganda 300mg  twice day

## 2023-12-29 NOTE — Progress Notes (Unsigned)
 Established Patient Office Visit  Subjective   Patient ID: Melanie Fritz, female    DOB: 05-24-1985  Age: 38 y.o. MRN: [id number]  Chief Complaint  Patient presents with   Medical Management of Chronic Issues    HPI Pt is a 38 yo female who presents to the clinic for medication refills.   Pt has been on adderall for years. Same dose is working ok but her focus is not good right now. She is more anxious, stressed, depressed. She is working long hours at work and in the middle of fixing their home up to sell. She has been taking klonapin twice a day. Her anxiety feels out of control. She has been on lexapro  off and on in the past and the last being about 1 year ago. She did feel like it helped some but feels like at the end it was not working as much. She failed effexor /wellbutrin /paxil  as well. She is making mistakes at worked and worries about how scattered her brain is. She denies any SI/HC.    ROS See HPI.    Objective:     BP 136/76   Pulse 76   Ht 5' 3 (1.6 m)   Wt 198 lb (89.8 kg)   SpO2 99%   BMI 35.07 kg/m  BP Readings from Last 3 Encounters:  12/29/23 136/76  10/06/23 116/71  12/30/22 137/84   Wt Readings from Last 3 Encounters:  12/29/23 198 lb (89.8 kg)  10/06/23 181 lb (82.1 kg)  06/30/23 185 lb (83.9 kg)    ..    12/29/2023    8:15 AM 10/06/2023    7:07 AM 06/30/2023    7:04 AM 04/02/2023    8:44 AM 12/30/2022    8:52 AM  Depression screen PHQ 2/9  Decreased Interest 1 0 0 0 0  Down, Depressed, Hopeless 1 0 0 0 0  PHQ - 2 Score 2 0 0 0 0  Altered sleeping 1 0 0 0 0  Tired, decreased energy 1 0 0 0 0  Change in appetite 1 0 0 0 0  Feeling bad or failure about yourself  1 0 0 0 0  Trouble concentrating 1 0 0 0 0  Moving slowly or fidgety/restless 0 0 0  0  Suicidal thoughts 0 0 0 0 0  PHQ-9 Score 7 0 0 0 0  Difficult doing work/chores Somewhat difficult Not difficult at all Not difficult at all Not difficult at all Not difficult at all   ..     12/29/2023    8:15 AM 10/06/2023    7:08 AM 06/30/2023    7:05 AM 04/02/2023    8:45 AM  GAD 7 : Generalized Anxiety Score  Nervous, Anxious, on Edge 1 0 1 0  Control/stop worrying 1 0 1   Worry too much - different things 1 0 1 0  Trouble relaxing 1 0 0   Restless 0 0 0 0  Easily annoyed or irritable 0 0 0 0  Afraid - awful might happen 0 0 0 0  Total GAD 7 Score 4 0 3   Anxiety Difficulty Somewhat difficult Not difficult at all Not difficult at all Not difficult at all      Physical Exam Constitutional:      Appearance: Normal appearance.  HENT:     Head: Normocephalic.  Cardiovascular:     Rate and Rhythm: Normal rate and regular rhythm.  Pulmonary:     Effort: Pulmonary effort is normal.  Breath sounds: Normal breath sounds.  Musculoskeletal:     Right lower leg: No edema.     Left lower leg: No edema.  Neurological:     Mental Status: She is alert.  Psychiatric:        Mood and Affect: Mood normal.        Assessment & Plan:  SABRASABRAEmmanuelle was seen today for medical management of chronic issues.  Diagnoses and all orders for this visit:  Anxiety -     clonazePAM  (KLONOPIN ) 0.5 MG tablet; Take 1 tablet (0.5 mg total) by mouth 2 (two) times daily. -     busPIRone  (BUSPAR ) 7.5 MG tablet; Take 1 tablet (7.5 mg total) by mouth 2 (two) times daily.  Recurrent major depressive disorder, in full remission (HCC) -     vortioxetine  HBr (TRINTELLIX ) 5 MG TABS tablet; Take 1 tablet (5 mg total) by mouth daily.  Adult ADHD -     amphetamine -dextroamphetamine  (ADDERALL) 20 MG tablet; Take 1 tablet (20 mg total) by mouth 3 (three) times daily. -     amphetamine -dextroamphetamine  (ADDERALL) 20 MG tablet; Take 1 tablet (20 mg total) by mouth 3 (three) times daily. -     amphetamine -dextroamphetamine  (ADDERALL) 20 MG tablet; Take 1 tablet (20 mg total) by mouth 3 (three) times daily.   Continue adderall, she has been controlled on this dose for quite sometime and do not believe  it is making her more anxious and not being able to focus Discussed how anxiety/depression can also effect cognition PHQ/GAD increased from last check Add OTC ashwaganda 300mg  bid Start trintelllix daily(samples given to start) Failed lexapro /effexor /wellbutrin /paxil  Start buspar  twice a day to help more with anxiety now Klonapin up to twice a day but only as needed Discussed daily walking and self care Follow up in 3 months   Return in about 3 months (around 03/29/2024).    Jermell Holeman, PA-C

## 2023-12-31 ENCOUNTER — Encounter: Payer: Self-pay | Admitting: Physician Assistant

## 2024-01-12 ENCOUNTER — Encounter: Payer: Self-pay | Admitting: Physician Assistant

## 2024-01-12 MED ORDER — BUSPIRONE HCL 15 MG PO TABS
15.0000 mg | ORAL_TABLET | Freq: Two times a day (BID) | ORAL | 0 refills | Status: DC
Start: 2024-01-12 — End: 2024-02-04

## 2024-01-12 MED ORDER — SERTRALINE HCL 50 MG PO TABS: 50.0000 mg | ORAL_TABLET | Freq: Every day | ORAL | 1 refills | Status: DC

## 2024-02-02 NOTE — Addendum Note (Signed)
 Addended by: ANTONIETTE VERMELL CROME on: 02/02/2024 04:18 PM   Modules accepted: Orders

## 2024-02-04 ENCOUNTER — Other Ambulatory Visit: Payer: Self-pay | Admitting: Physician Assistant

## 2024-02-29 ENCOUNTER — Telehealth: Payer: Self-pay

## 2024-02-29 MED ORDER — AMPHETAMINE-DEXTROAMPHETAMINE 20 MG PO TABS: 20.0000 mg | ORAL_TABLET | Freq: Three times a day (TID) | ORAL | 0 refills | Status: DC

## 2024-02-29 NOTE — Telephone Encounter (Unsigned)
 Copied from CRM #[id number]. Topic: Clinical - Prescription Issue >> Feb 29, 2024  9:11 AM Jasmin G wrote: Reason for CRM: Pt requested for her Adderall to be refilled today as she states that she will be travelling tomorrow morning and won't be back until Friday and is in need of med. Call pt back if needed at [phone]. >> Feb 29, 2024  3:36 PM Jakyia R wrote: RX has been received by the pharmacy and they can fill for the pt today however the script is written to start on the 7th and the pt needs it to be written for today the 4th as she  will be leaving tomorrow and will not be back until next Friday Nov 14th not the 7th. The pt has enough until possible Monday, but she will be in a different state from Monday Until Friday  >> Feb 29, 2024  3:07 PM Delon T wrote: Checking status of refill

## 2024-02-29 NOTE — Telephone Encounter (Signed)
 Her last fill date ws 10/10 I sent to pharmacy to be ready by 11/7 which is 3 days early from fill date.

## 2024-02-29 NOTE — Telephone Encounter (Signed)
 Copied from CRM #[id number]. Topic: Clinical - Prescription Issue >> Feb 29, 2024  3:37 PM Melanie Fritz wrote: RX for amphetamine -dextroamphetamine  (ADDERALL) 20 MG tablet has been received by the pharmacy and they can fill for the pt today however the script is written to start on the 7th and the pt needs it to be written for today the 4th as she  will be leaving tomorrow and will not be back until next Friday Nov 14th not the 7th. The pt has enough until possible Monday, but she will be in a different state from Monday Until NEXT Friday

## 2024-02-29 NOTE — Telephone Encounter (Deleted)
 Copied from CRM #[id number]. Topic: Clinical - Prescription Issue >> Feb 29, 2024  9:11 AM Jasmin G wrote: Reason for CRM: Pt requested for her Adderall to be refilled today as she states that she will be travelling tomorrow morning and won't be back until Friday and is in need of med. Call pt back if needed at [phone].

## 2024-02-29 NOTE — Telephone Encounter (Signed)
 Copied from CRM #[id number]. Topic: Clinical - Prescription Issue >> Feb 29, 2024  9:11 AM Jasmin G wrote: Reason for CRM: Pt requested for her Adderall to be refilled today as she states that she will be travelling tomorrow morning and won't be back until Friday and is in need of med. Call pt back if needed at [phone].

## 2024-02-29 NOTE — Telephone Encounter (Signed)
 Error - CRM addressed in separate encounter.

## 2024-02-29 NOTE — Telephone Encounter (Signed)
..  PDMP reviewed during this encounter. Last filled on 10/10.  You should be good until Friday.

## 2024-02-29 NOTE — Telephone Encounter (Signed)
 Called and spoke with patient [facility] - will get the medication ready for patient today.

## 2024-02-29 NOTE — Telephone Encounter (Signed)
 Adderall not showing in patient current med list

## 2024-03-28 ENCOUNTER — Telehealth: Admitting: Physician Assistant

## 2024-03-28 ENCOUNTER — Encounter: Payer: Self-pay | Admitting: Physician Assistant

## 2024-03-28 VITALS — Ht 63.0 in | Wt 188.0 lb

## 2024-03-28 DIAGNOSIS — F909 Attention-deficit hyperactivity disorder, unspecified type: Secondary | ICD-10-CM

## 2024-03-28 DIAGNOSIS — E66811 Obesity, class 1: Secondary | ICD-10-CM

## 2024-03-28 DIAGNOSIS — F3342 Major depressive disorder, recurrent, in full remission: Secondary | ICD-10-CM | POA: Diagnosis not present

## 2024-03-28 DIAGNOSIS — Z6833 Body mass index (BMI) 33.0-33.9, adult: Secondary | ICD-10-CM

## 2024-03-28 DIAGNOSIS — F419 Anxiety disorder, unspecified: Secondary | ICD-10-CM

## 2024-03-28 MED ORDER — AMPHETAMINE-DEXTROAMPHETAMINE 20 MG PO TABS: 20.0000 mg | ORAL_TABLET | Freq: Three times a day (TID) | ORAL | 0 refills | Status: AC

## 2024-03-28 MED ORDER — AMPHETAMINE-DEXTROAMPHETAMINE 20 MG PO TABS: 20.0000 mg | ORAL_TABLET | Freq: Three times a day (TID) | ORAL | 0 refills | Status: DC

## 2024-03-28 MED ORDER — METFORMIN HCL 500 MG PO TABS: 500.0000 mg | ORAL_TABLET | Freq: Two times a day (BID) | ORAL | 0 refills | Status: AC

## 2024-03-28 NOTE — Progress Notes (Signed)
 ..Virtual Visit via Video Note  I connected with Melanie Fritz on 03/28/24 at  8:30 AM EST by a video enabled telemedicine application and verified that I am speaking with the correct person using two identifiers.  Location: Patient: work Provider: clinic  .SABRAParticipating in visit:  Patient: Melanie Fritz Provider: Vermell Bologna PA-C   I discussed the limitations of evaluation and management by telemedicine and the availability of in person appointments. The patient expressed understanding and agreed to proceed.  History of Present Illness: SABRASABRADiscussed the use of AI scribe software for clinical note transcription with the patient, who gave verbal consent to proceed.  History of Present Illness Melanie Fritz is a 38 year old female who presents with concerns about weight management and ADHD refills.   Weight management difficulties - Persistent difficulty with weight loss despite dietary modifications, including reducing portion sizes, limiting snacking, and drinking only water - Current weight is approximately 188 pounds with a height of 5 feet 3 inches - Seeking pharmacologic assistance for weight loss, unable to use injectable medications - Interested in exploring oral weight management options, including metformin  Fatigue and decreased energy - Experiencing lack of energy, attributed to elevated body weight - Increasing physical activity by walking after meals and taking breaks, which provides some benefit  Gastrointestinal symptoms - Recent initiation of probiotic associated with stomach upset  Psychotropic medication use and side effects - Current regimen includes one Klonopin  in the morning and two Adderall per day, reduced from three daily - Discontinued Buspar  and sertraline  due to adverse effects, including weakness and nocturnal jerking movements  ADHD - Controlled with Adderall      Observations/Objective: No acute distress Normal mood Normal breathing  .SABRA     03/28/2024    9:02 AM 12/29/2023    8:15 AM 10/06/2023    7:07 AM 06/30/2023    7:04 AM 04/02/2023    8:44 AM  Depression screen PHQ 2/9  Decreased Interest 0 1 0 0 0  Down, Depressed, Hopeless 0 1 0 0 0  PHQ - 2 Score 0 2 0 0 0  Altered sleeping 0 1 0 0 0  Tired, decreased energy 3 1 0 0 0  Change in appetite 3 1 0 0 0  Feeling bad or failure about yourself  0 1 0 0 0  Trouble concentrating 0 1 0 0 0  Moving slowly or fidgety/restless 0 0 0 0   Suicidal thoughts 0 0 0 0 0  PHQ-9 Score 6 7  0  0  0   Difficult doing work/chores Not difficult at all Somewhat difficult Not difficult at all Not difficult at all Not difficult at all     Data saved with a previous flowsheet row definition   .SABRA    03/28/2024    9:03 AM 12/29/2023    8:15 AM 10/06/2023    7:08 AM 06/30/2023    7:05 AM  GAD 7 : Generalized Anxiety Score  Nervous, Anxious, on Edge 1 1 0 1  Control/stop worrying 1 1 0 1  Worry too much - different things 1 1 0 1  Trouble relaxing 0 1 0 0  Restless 0 0 0 0  Easily annoyed or irritable 1 0 0 0  Afraid - awful might happen 0 0 0 0  Total GAD 7 Score 4 4 0 3  Anxiety Difficulty Not difficult at all Somewhat difficult Not difficult at all Not difficult at all     Assessment and Plan: SABRASABRADiagnoses  and all orders for this visit:  Class 1 obesity due to excess calories without serious comorbidity with body mass index (BMI) of 33.0 to 33.9 in adult -     metFORMIN (GLUCOPHAGE) 500 MG tablet; Take 1 tablet (500 mg total) by mouth 2 (two) times daily with a meal.  Recurrent major depressive disorder, in full remission  Adult ADHD -     amphetamine -dextroamphetamine  (ADDERALL) 20 MG tablet; Take 1 tablet (20 mg total) by mouth 3 (three) times daily. -     amphetamine -dextroamphetamine  (ADDERALL) 20 MG tablet; Take 1 tablet (20 mg total) by mouth 3 (three) times daily. -     amphetamine -dextroamphetamine  (ADDERALL) 20 MG tablet; Take 1 tablet (20 mg total) by mouth 3 (three) times  daily.  Anxiety   Assessment & Plan Obesity, class 1 BMI 33, class 1 obesity. Motivated for weight loss. Declined weight loss injections due to cost. Discussed pharmacological options: Contrave and metformin. Explained metformin's role in improving insulin sensitivity and potential GI side effects. Discussed lifestyle modifications: intermittent fasting and post-meal walking. - Prescribed metformin with breakfast and dinner. - Discussed Contrave as an option, cost up to $100/month via mail order. - Encouraged intermittent fasting and post-meal walking. - Suggested Weight Watchers for structured weight management.  Adult attention-deficit hyperactivity disorder (ADHD) Taking Adderall 20 mg twice daily due to reduced work hours. Patient reports fatigue and believes weight is contributing to poor energy levels and overall health. - Continue Adderall 20 mg with refills for three times daily dosing.  MDD, in remission - PHQ stable - Feels like depressed mood is related to weight - Not on medication currently  Anxiety GAD stable Previously on Buspirone  and sertraline , discontinued due to side effects. Currently on Klonopin  0.5 mg daily. Anxiety symptoms present but controlled.  - Continue Klonopin  0.5 mg once daily in the morning.    Follow Up Instructions:    I discussed the assessment and treatment plan with the patient. The patient was provided an opportunity to ask questions and all were answered. The patient agreed with the plan and demonstrated an understanding of the instructions.   The patient was advised to call back or seek an in-person evaluation if the symptoms worsen or if the condition fails to improve as anticipated.  Curtisha Bendix, PA-C

## 2024-04-28 ENCOUNTER — Encounter: Payer: Self-pay | Admitting: Physician Assistant

## 2024-04-28 DIAGNOSIS — F419 Anxiety disorder, unspecified: Secondary | ICD-10-CM

## 2024-04-28 MED ORDER — CLONAZEPAM 0.5 MG PO TABS: 0.5000 mg | ORAL_TABLET | Freq: Two times a day (BID) | ORAL | 1 refills | Status: DC | PRN

## 2024-05-17 ENCOUNTER — Telehealth: Payer: Self-pay

## 2024-05-17 NOTE — Telephone Encounter (Signed)
 Patient scheduled to see Melanie Fritz in march

## 2024-05-17 NOTE — Telephone Encounter (Signed)
 Copied from CRM #[id number]. Topic: Appointments - Transfer of Care >> May 17, 2024 12:42 PM Sophia H wrote: Pt is requesting to transfer FROM: PA [facility]back Pt is requesting to transfer TO: PA [facility] Gave  Reason for requested transfer: Patient states she doesn't feel like her provider really hears her.  It is the responsibility of the team the patient would like to transfer to [facility]) to reach out to the patient if for any reason this transfer is not acceptable.

## 2024-05-17 NOTE — Telephone Encounter (Signed)
Ok with transfer 

## 2024-05-18 ENCOUNTER — Ambulatory Visit: Payer: Self-pay

## 2024-05-18 NOTE — Telephone Encounter (Signed)
 FYI Only or Action Required?: FYI only for provider: appointment scheduled on 05/19/2024.  Patient was last seen in primary care on 03/28/2024 by Antoniette Vermell CROME, PA-C.  Called Nurse Triage reporting Anxiety.  Symptoms began several days ago.  Interventions attempted: Prescription medications: klonopin .  Symptoms are: unchanged.  Triage Disposition: See PCP When Office is Open (Within 3 Days)  Patient/caregiver understands and will follow disposition?: Yes  Message from [place] B sent at 05/18/2024  1:54 PM EST  Reason for Triage: patient is having panic attacks   Reason for Disposition  MODERATE anxiety (e.g., persistent or frequent anxiety symptoms; interferes with sleep, school, or work)  Answer Assessment - Initial Assessment Questions 1. CONCERN: Did anything happen that prompted you to call today?      Medical management for panic attacks Would like to resume Lexapro   Changes at homes 2. ANXIETY SYMPTOMS: Can you describe how you (your loved one; patient) have been feeling? (e.g., tense, restless, panicky, anxious, keyed up, overwhelmed, sense of impending doom).      Difficulty sleeping, difficulty staying at work, chest feels tight, agitated 3. ONSET: How long have you been feeling this way? (e.g., hours, days, weeks)     About a week 4. SEVERITY: How would you rate the level of anxiety? (e.g., 0 - 10; or mild, moderate, severe).     Reports that anxiety is severe for the past couple of days 5. FUNCTIONAL IMPAIRMENT: How have these feelings affected your ability to do daily activities? Have you had more difficulty than usual doing your normal daily activities? (e.g., getting better, same, worse; self-care, school, work, interactions)     Leaving work early 6. HISTORY: Have you felt this way before? Have you ever been diagnosed with an anxiety problem in the past? (e.g., generalized anxiety disorder, panic attacks, PTSD). If Yes, ask: How was this problem  treated? (e.g., medicines, counseling, etc.)     History of anxiety and panic attacks 7. RISK OF HARM - SUICIDAL IDEATION: Do you ever have thoughts of hurting or killing yourself? If Yes, ask:  Do you have these feelings now? Do you have a plan on how you would do this?     denies 8. TREATMENT:  What has been done so far to treat this anxiety? (e.g., medicines, relaxation strategies). What has helped?     Lexapro  and klonopin  9. THERAPIST: Do you have a counselor or therapist? If Yes, ask: What is their name?     No therapist 10. POTENTIAL TRIGGERS: Do you drink caffeinated beverages (e.g., coffee, colas, teas), and how much daily? Do you drink alcohol or use any drugs? Have you started any new medicines recently?       Reports water drinking only, denies use of other substances 11. PATIENT SUPPORT: Who is with you now? Who do you live with? Do you have family or friends who you can talk to?        Mom is supportive 13. PREGNANCY: Is there any chance you are pregnant? When was your last menstrual period?       LMP Apr 27 2024  Protocols used: Anxiety and Panic Attack-A-AH

## 2024-05-18 NOTE — Telephone Encounter (Signed)
 FYI - would like to resume Lexapro  for panic attacks.

## 2024-05-19 ENCOUNTER — Ambulatory Visit: Admitting: Student

## 2024-05-19 MED ORDER — ESCITALOPRAM OXALATE 10 MG PO TABS: 10.0000 mg | ORAL_TABLET | Freq: Every day | ORAL | 1 refills | Status: AC

## 2024-05-19 MED ORDER — ALPRAZOLAM 0.5 MG PO TABS: 0.5000 mg | ORAL_TABLET | Freq: Two times a day (BID) | ORAL | 0 refills | Status: DC | PRN

## 2024-05-19 NOTE — Addendum Note (Signed)
 Addended by: ANTONIETTE VERMELL CROME on: 05/19/2024 07:17 AM   Modules accepted: Orders

## 2024-05-19 NOTE — Telephone Encounter (Signed)
 Attempted to contact the patient. No answer. Left a brief vm msg for the patient that the Lexapro  rx was sent to the pharmacy. Direct call back information provided.

## 2024-05-19 NOTE — Telephone Encounter (Signed)
 Sent to pharmacy

## 2024-05-25 ENCOUNTER — Telehealth: Payer: Self-pay

## 2024-05-25 NOTE — Telephone Encounter (Signed)
 Copied from CRM #[id number]. Topic: Clinical - Medication Refill >> May 25, 2024  9:42 AM Nathanel BROCKS wrote: Pt is leaving at 2:00 today to go on business trip. Can you please call this in today.   Medication: amphetamine -dextroamphetamine  (ADDERALL) 20 MG tablet   Has the patient contacted their pharmacy? Yes  This is the patient's preferred pharmacy:   [facility] [phone] - [place], [place] - [address] [place] [address] Phone: [phone] Fax: [phone]  Is this the correct pharmacy for this prescription? Yes If no, delete pharmacy and type the correct one.   Has the prescription been filled recently? Yes  Is the patient out of the medication? Yes  Has the patient been seen for an appointment in the last year OR does the patient have an upcoming appointment? Yes  Can we respond through MyChart? Yes  Agent: Please be advised that Rx refills may take up to 3 business days. We ask that you follow-up with your pharmacy. >> May 25, 2024 12:40 PM Delon T wrote: Patient needs to know which pharmacy to pick up medication- [phone]

## 2024-05-26 NOTE — Telephone Encounter (Signed)
 Please call and ok for pick up early.

## 2024-05-29 NOTE — Telephone Encounter (Signed)
 The patient has transfer care from [facility]. Her appointment is scheduled for 07/18/24 to address the following concerns. Request for the Lexapro  rx refill was sent to the pharmacy on 05/19/24 from [facility].

## 2024-05-30 MED ORDER — CLONAZEPAM 1 MG PO TABS: 1.0000 mg | ORAL_TABLET | Freq: Two times a day (BID) | ORAL | 0 refills | Status: AC | PRN

## 2024-05-30 NOTE — Addendum Note (Signed)
 Addended by: ANTONIETTE VERMELL CROME on: 05/30/2024 04:02 PM   Modules accepted: Orders

## 2024-05-30 NOTE — Addendum Note (Signed)
 Addended by: ANTONIETTE VERMELL CROME on: 05/30/2024 04:42 PM   Modules accepted: Orders

## 2024-06-28 ENCOUNTER — Ambulatory Visit: Admitting: Physician Assistant

## 2024-07-18 ENCOUNTER — Encounter: Admitting: Urgent Care
